# Patient Record
Sex: Female | Born: 1990
Health system: Southern US, Community
[De-identification: ages and names within clinical notes are randomized; demographics above are authoritative.]

## PROBLEM LIST (undated history)

## (undated) ENCOUNTER — Inpatient Hospital Stay (HOSPITAL_COMMUNITY): Payer: Self-pay

## (undated) DIAGNOSIS — F909 Attention-deficit hyperactivity disorder, unspecified type: Secondary | ICD-10-CM

## (undated) DIAGNOSIS — R112 Nausea with vomiting, unspecified: Secondary | ICD-10-CM

## (undated) DIAGNOSIS — R0600 Dyspnea, unspecified: Secondary | ICD-10-CM

## (undated) DIAGNOSIS — E119 Type 2 diabetes mellitus without complications: Secondary | ICD-10-CM

## (undated) DIAGNOSIS — Z9889 Other specified postprocedural states: Secondary | ICD-10-CM

## (undated) DIAGNOSIS — N390 Urinary tract infection, site not specified: Secondary | ICD-10-CM

## (undated) HISTORY — PX: APPENDECTOMY: SHX54

## (undated) HISTORY — PX: OTHER SURGICAL HISTORY: SHX169

## (undated) HISTORY — DX: Type 2 diabetes mellitus without complications: E11.9

---
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
## 2011-03-25 LAB — URINALYSIS, ROUTINE W REFLEX MICROSCOPIC
Bilirubin Urine: NEGATIVE
Ketones, ur: NEGATIVE
Nitrite: NEGATIVE
pH: 7.5

## 2011-03-25 LAB — COMPREHENSIVE METABOLIC PANEL
BUN: 6
Calcium: 9.3
Creatinine, Ser: 0.83
Glucose, Bld: 93
Sodium: 139
Total Protein: 6.4

## 2011-03-25 LAB — CBC
HCT: 35.2
Hemoglobin: 12.1
MCHC: 34.3 — ABNORMAL HIGH
MCV: 83.4
RDW: 13.9 — ABNORMAL HIGH

## 2011-03-25 LAB — DIFFERENTIAL
Lymphocytes Relative: 46
Lymphs Abs: 3.9
Monocytes Relative: 5
Neutro Abs: 4
Neutrophils Relative %: 47

## 2011-03-25 LAB — TSH: TSH: 2.1

## 2011-03-25 LAB — PREGNANCY, URINE: Preg Test, Ur: NEGATIVE

## 2011-10-10 ENCOUNTER — Emergency Department (HOSPITAL_COMMUNITY)
Admission: EM | Admit: 2011-10-10 | Discharge: 2011-10-11 | Disposition: A | Discharge status: Home / Self Care | Payer: 59 | Attending: Emergency Medicine | Admitting: Emergency Medicine

## 2011-10-10 ENCOUNTER — Emergency Department (HOSPITAL_COMMUNITY)
Admission: EM | Admit: 2011-10-10 | Discharge: 2011-10-10 | Disposition: A | Discharge status: Home / Self Care | Payer: 59 | Attending: Emergency Medicine | Admitting: Emergency Medicine

## 2011-10-10 ENCOUNTER — Encounter (HOSPITAL_COMMUNITY): Payer: Self-pay | Admitting: *Deleted

## 2011-10-10 ENCOUNTER — Emergency Department (HOSPITAL_COMMUNITY): Payer: 59

## 2011-10-10 DIAGNOSIS — R42 Dizziness and giddiness: Secondary | ICD-10-CM | POA: Insufficient documentation

## 2011-10-10 DIAGNOSIS — R197 Diarrhea, unspecified: Secondary | ICD-10-CM | POA: Insufficient documentation

## 2011-10-10 DIAGNOSIS — R110 Nausea: Secondary | ICD-10-CM | POA: Insufficient documentation

## 2011-10-10 DIAGNOSIS — R1030 Lower abdominal pain, unspecified: Secondary | ICD-10-CM

## 2011-10-10 DIAGNOSIS — R109 Unspecified abdominal pain: Secondary | ICD-10-CM | POA: Insufficient documentation

## 2011-10-10 DIAGNOSIS — N898 Other specified noninflammatory disorders of vagina: Secondary | ICD-10-CM | POA: Insufficient documentation

## 2011-10-10 DIAGNOSIS — N939 Abnormal uterine and vaginal bleeding, unspecified: Secondary | ICD-10-CM

## 2011-10-10 DIAGNOSIS — K529 Noninfective gastroenteritis and colitis, unspecified: Secondary | ICD-10-CM

## 2011-10-10 DIAGNOSIS — K5289 Other specified noninfective gastroenteritis and colitis: Secondary | ICD-10-CM | POA: Insufficient documentation

## 2011-10-10 LAB — POCT I-STAT, CHEM 8
HCT: 38 % (ref 36.0–46.0)
Hemoglobin: 12.9 g/dL (ref 12.0–15.0)
Potassium: 3.3 mEq/L — ABNORMAL LOW (ref 3.5–5.1)
Sodium: 140 mEq/L (ref 135–145)

## 2011-10-10 LAB — DIFFERENTIAL
Basophils Absolute: 0 10*3/uL (ref 0.0–0.1)
Basophils Relative: 0 % (ref 0–1)
Basophils Relative: 0 % (ref 0–1)
Lymphs Abs: 3.6 10*3/uL (ref 0.7–4.0)
Monocytes Relative: 6 % (ref 3–12)
Monocytes Relative: 4 % (ref 3–12)
Neutro Abs: 2.9 10*3/uL (ref 1.7–7.7)
Neutro Abs: 3.1 10*3/uL (ref 1.7–7.7)
Neutrophils Relative %: 41 % — ABNORMAL LOW (ref 43–77)
Neutrophils Relative %: 48 % (ref 43–77)

## 2011-10-10 LAB — COMPREHENSIVE METABOLIC PANEL
ALT: 12 U/L (ref 0–35)
Albumin: 3.9 g/dL (ref 3.5–5.2)
Alkaline Phosphatase: 46 U/L (ref 39–117)
BUN: 8 mg/dL (ref 6–23)
Chloride: 104 mEq/L (ref 96–112)
Glucose, Bld: 112 mg/dL — ABNORMAL HIGH (ref 70–99)
Potassium: 3.7 mEq/L (ref 3.5–5.1)
Sodium: 138 mEq/L (ref 135–145)
Total Bilirubin: 0.3 mg/dL (ref 0.3–1.2)

## 2011-10-10 LAB — URINALYSIS, ROUTINE W REFLEX MICROSCOPIC
Bilirubin Urine: NEGATIVE
Glucose, UA: NEGATIVE mg/dL
Hgb urine dipstick: NEGATIVE
Ketones, ur: NEGATIVE mg/dL
Leukocytes, UA: NEGATIVE
Nitrite: NEGATIVE
Nitrite: NEGATIVE
Specific Gravity, Urine: 1.025 (ref 1.005–1.030)
Specific Gravity, Urine: 1.022 (ref 1.005–1.030)
Urobilinogen, UA: 0.2 mg/dL (ref 0.0–1.0)
pH: 7 (ref 5.0–8.0)

## 2011-10-10 LAB — CBC
Hemoglobin: 12.6 g/dL (ref 12.0–15.0)
Hemoglobin: 12.8 g/dL (ref 12.0–15.0)
MCHC: 34.8 g/dL (ref 30.0–36.0)
Platelets: 244 10*3/uL (ref 150–400)
RBC: 4.33 MIL/uL (ref 3.87–5.11)

## 2011-10-10 LAB — URINE MICROSCOPIC-ADD ON

## 2011-10-10 LAB — LIPASE, BLOOD: Lipase: 38 U/L (ref 11–59)

## 2011-10-10 MED ORDER — SODIUM CHLORIDE 0.9 % IV SOLN
1000.0000 mL | INTRAVENOUS | Status: DC
  Administered 2011-10-10: 1000 mL via INTRAVENOUS

## 2011-10-10 MED ORDER — GI COCKTAIL ~~LOC~~
30.0000 mL | Freq: Once | ORAL | Status: AC
  Administered 2011-10-10: 30 mL via ORAL
  Filled 2011-10-10: qty 30

## 2011-10-10 MED ORDER — SODIUM CHLORIDE 0.9 % IV SOLN
1000.0000 mL | Freq: Once | INTRAVENOUS | Status: AC
  Administered 2011-10-10: 1000 mL via INTRAVENOUS

## 2011-10-10 MED ORDER — ONDANSETRON HCL 4 MG/2ML IJ SOLN
4.0000 mg | Freq: Once | INTRAMUSCULAR | Status: AC
  Administered 2011-10-10: 4 mg via INTRAVENOUS
  Filled 2011-10-10: qty 2

## 2011-10-10 MED ORDER — MORPHINE SULFATE 4 MG/ML IJ SOLN
4.0000 mg | Freq: Once | INTRAMUSCULAR | Status: AC
  Administered 2011-10-10: 4 mg via INTRAVENOUS
  Filled 2011-10-10: qty 1

## 2011-10-10 MED ORDER — OMEPRAZOLE 20 MG PO CPDR: 20.0000 mg | DELAYED_RELEASE_CAPSULE | Freq: Every day | ORAL | Status: DC

## 2011-10-10 MED ORDER — MORPHINE SULFATE 4 MG/ML IJ SOLN: 4.0000 mg | Freq: Once | INTRAMUSCULAR | Status: DC

## 2011-10-10 NOTE — Discharge Instructions (Signed)
B.R.A.T. Diet Your doctor has recommended the B.R.A.T. diet for you or your child until the condition improves. This is often used to help control diarrhea and vomiting symptoms. If you or your child can tolerate clear liquids, you may have:  Bananas.   Rice.   Applesauce.   Toast (and other simple starches such as crackers, potatoes, noodles).  Be sure to avoid dairy products, meats, and fatty foods until symptoms are better. Fruit juices such as apple, grape, and prune juice can make diarrhea worse. Avoid these. Continue this diet for 2 days or as instructed by your caregiver. Document Released: 06/01/2005 Document Revised: 05/21/2011 Document Reviewed: 11/18/2006 [employer] Patient Information 2012 [employer].Viral Gastroenteritis Viral gastroenteritis is also known as stomach flu. This condition affects the stomach and intestinal tract. It can cause sudden diarrhea and vomiting. The illness typically lasts 3 to 8 days. Most people develop an immune response that eventually gets rid of the virus. While this natural response develops, the virus can make you quite ill. CAUSES  Many different viruses can cause gastroenteritis, such as rotavirus or noroviruses. You can catch one of these viruses by consuming contaminated food or water. You may also catch a virus by sharing utensils or other personal items with an infected person or by touching a contaminated surface. SYMPTOMS  The most common symptoms are diarrhea and vomiting. These problems can cause a severe loss of body fluids (dehydration) and a body salt (electrolyte) imbalance. Other symptoms may include:  Fever.   Headache.   Fatigue.   Abdominal pain.  DIAGNOSIS  Your caregiver can usually diagnose viral gastroenteritis based on your symptoms and a physical exam. A stool sample may also be taken to test for the presence of viruses or other infections. TREATMENT  This illness typically goes away on its own. Treatments are aimed  at rehydration. The most serious cases of viral gastroenteritis involve vomiting so severely that you are not able to keep fluids down. In these cases, fluids must be given through an intravenous line (IV). HOME CARE INSTRUCTIONS   Drink enough fluids to keep your urine clear or pale yellow. Drink small amounts of fluids frequently and increase the amounts as tolerated.   Ask your caregiver for specific rehydration instructions.   Avoid:   Foods high in sugar.   Alcohol.   Carbonated drinks.   Tobacco.   Juice.   Caffeine drinks.   Extremely hot or cold fluids.   Fatty, greasy foods.   Too much intake of anything at one time.   Dairy products until 24 to 48 hours after diarrhea stops.   You may consume probiotics. Probiotics are active cultures of beneficial bacteria. They may lessen the amount and number of diarrheal stools in adults. Probiotics can be found in yogurt with active cultures and in supplements.   Wash your hands well to avoid spreading the virus.   Only take over-the-counter or prescription medicines for pain, discomfort, or fever as directed by your caregiver. Do not give aspirin to children. Antidiarrheal medicines are not recommended.   Ask your caregiver if you should continue to take your regular prescribed and over-the-counter medicines.   Keep all follow-up appointments as directed by your caregiver.  SEEK IMMEDIATE MEDICAL CARE IF:   You are unable to keep fluids down.   You do not urinate at least once every 6 to 8 hours.   You develop shortness of breath.   You notice blood in your stool or vomit. This may   look like coffee grounds.   You have abdominal pain that increases or is concentrated in one small area (localized).   You have persistent vomiting or diarrhea.   You have a fever.   The patient is a child younger than 3 months, and he or she has a fever.   The patient is a child older than 3 months, and he or she has a fever and  persistent symptoms.   The patient is a child older than 3 months, and he or she has a fever and symptoms suddenly get worse.   The patient is a baby, and he or she has no tears when crying.  MAKE SURE YOU:   Understand these instructions.   Will watch your condition.   Will get help right away if you are not doing well or get worse.  Document Released: 06/01/2005 Document Revised: 05/21/2011 Document Reviewed: 03/18/2011 [employer] Patient Information 2012 [employer]. 

## 2011-10-10 NOTE — ED Provider Notes (Signed)
Pt was transferred to CDU.  Her pain is improved after GI cocktail.  When she stood up the pain was worsened in CT scan.  Pain is located at left flank and mid abdomen.  No N/V.  I've given Morphine 4mg  IV if needed.  Awaiting results of CT scan.    Reviewed CT scan results with Dr. Nicanor Alcon and the radiologist.  Reviewed with pt that she has accumulation near her appendix that isn't showing appendicitis but does put pt at risk for appendicitis in the future.  I've discussed signs and sx that would prompt return.  Abd pain likely due to gastroenteritis.  Given omeprazole for sx relief.  Plan to f/u with gynecologist.  Pt and family voiced understanding.  Lindley Magnus [place], [place] 10/10/11 [phone]

## 2011-10-10 NOTE — ED Provider Notes (Signed)
History     CSN: [id number]  Arrival date & time 10/10/11  0865   First MD Initiated Contact with Patient 10/10/11 0423      Chief Complaint  Patient presents with  . Abdominal Pain    (Consider location/radiation/quality/duration/timing/severity/associated sxs/prior treatment) Patient is a 21 y.o. female presenting with abdominal pain. The history is provided by the patient. No language interpreter was used.  Abdominal Pain The primary symptoms of the illness include abdominal pain, nausea and diarrhea. The current episode started 3 to 5 hours ago. The onset of the illness was sudden. The problem has not changed since onset. The abdominal pain began 3 to 5 hours ago. The pain came on suddenly. The abdominal pain has been unchanged since its onset. The abdominal pain is located in the LLQ. The abdominal pain does not radiate. The severity of the abdominal pain is 10/10. The abdominal pain is relieved by nothing. Exacerbated by: nothing.  Associated with: nothing she has daily diarrhea. The patient states that she believes she is currently not pregnant. The patient has not had a change in bowel habit. Symptoms associated with the illness do not include chills, heartburn, constipation, urgency or frequency. Significant associated medical issues do not include PUD or inflammatory bowel disease.    History reviewed. No pertinent past medical history.  History reviewed. No pertinent past surgical history.  No family history on file.  History  Substance Use Topics  . Smoking status: Never Smoker   . Smokeless tobacco: Not on file  . Alcohol Use: No    OB History    Grav Para Term Preterm Abortions TAB SAB Ect Mult Living                  Review of Systems  Constitutional: Negative for chills.  Gastrointestinal: Positive for nausea, abdominal pain and diarrhea. Negative for heartburn and constipation.  Genitourinary: Negative for urgency and frequency.  All other systems  reviewed and are negative.    Allergies  Review of patient's allergies indicates no known allergies.  Home Medications   Current Outpatient Rx  Name Route Sig Dispense Refill  . LISDEXAMFETAMINE DIMESYLATE 50 MG PO CAPS Oral Take 50 mg by mouth every morning.      BP 122/77  Pulse 67  Temp(Src) 97.7 F (36.5 C) (Oral)  Resp 12  SpO2 99%  LMP 10/10/2011  Physical Exam  Constitutional: She is oriented to person, place, and time. She appears well-developed and well-nourished.  HENT:  Head: Normocephalic and atraumatic.  Mouth/Throat: Oropharynx is clear and moist.  Eyes: Conjunctivae are normal. Pupils are equal, round, and reactive to light.  Neck: Normal range of motion. Neck supple.  Cardiovascular: Normal rate and regular rhythm.   Pulmonary/Chest: Effort normal and breath sounds normal.  Abdominal: Soft. Bowel sounds are normal. There is no rebound and no guarding.    Genitourinary: No vaginal discharge found.       chaperone  Musculoskeletal: Normal range of motion.  Neurological: She is alert and oriented to person, place, and time.  Skin: Skin is warm and dry.  Psychiatric: She has a normal mood and affect.    ED Course  Procedures (including critical care time)  Labs Reviewed  DIFFERENTIAL - Abnormal; Notable for the following:    Neutrophils Relative 41 (*)    Lymphocytes Relative 51 (*)    All other components within normal limits  COMPREHENSIVE METABOLIC PANEL - Abnormal; Notable for the following:    Glucose, Bld  112 (*)    GFR calc non Af Amer 76 (*)    GFR calc Af Amer 89 (*)    All other components within normal limits  URINALYSIS, ROUTINE W REFLEX MICROSCOPIC - Abnormal; Notable for the following:    APPearance CLOUDY (*)    Hgb urine dipstick MODERATE (*)    All other components within normal limits  URINE MICROSCOPIC-ADD ON - Abnormal; Notable for the following:    Squamous Epithelial / LPF FEW (*)    All other components within normal  limits  WET PREP, GENITAL - Abnormal; Notable for the following:    Clue Cells Wet Prep HPF POC FEW (*)    All other components within normal limits  CBC  LIPASE, BLOOD  PREGNANCY, URINE  GC/CHLAMYDIA PROBE AMP, GENITAL   No results found.   No diagnosis found.    MDM  Follow up with GYN, fifteen days of menses.  Must have pap smear and ongoing care for menorrhagia.         Jasmine Awe, MD 10/10/11 [phone]

## 2011-10-10 NOTE — ED Notes (Signed)
Pt states that she is having abdominal pain that starts in her belly button and radiates out. Pt states that pressure is better when she lays on her stomach she feel better. Pt states that she has had her period for the past 15 days and is recently on [facility] pills started Monday. Pt denies problems with bowel movements or urination.

## 2011-10-10 NOTE — ED Notes (Signed)
Patient currently resting quietly in bed; no respiratory or acute distress noted.  PA currently at bedside; will continue to monitor.

## 2011-10-10 NOTE — ED Provider Notes (Signed)
History     CSN: [id number]  Arrival date & time 10/10/11  2007   First MD Initiated Contact with Patient 10/10/11 2105      Chief Complaint  Patient presents with  . Nausea    (Consider location/radiation/quality/duration/timing/severity/associated sxs/prior treatment) HPI Comments: Patient was seen in the Emergency Room this morning for abdominal pain, vaginal bleeding, daily diarrhea, d/c home with suspected gastroenteritis and abnormal vaginal bleeding.  States that the pain has continued, that it is now "lower down" in her abdomen than before, she has developed nausea, vomiting ("only in my mouth"), lightheadedness, is unable to eat. States initially the pain was epigastric(2 days ago), this morning was periumbilical (1am, sudden onset, sharp in nature), and now pt points to suprapubic region to indicate worst pain.  States her vaginal bleeding has been lighter today and she has not had any diarrhea, no bowel movement since yesterday.  Mother notes patient has had intermittent diarrhea and hard stools for years, a few days ago had large amount of blood in her bowel movement.  Notes patient's grandmother has Crohn's disease. Denies fevers, urinary symptoms.    The history is provided by the patient.    History reviewed. No pertinent past medical history.  History reviewed. No pertinent past surgical history.  No family history on file.  History  Substance Use Topics  . Smoking status: Never Smoker   . Smokeless tobacco: Not on file  . Alcohol Use: No    OB History    Grav Para Term Preterm Abortions TAB SAB Ect Mult Living                  Review of Systems  Constitutional: Positive for appetite change. Negative for fever.  Gastrointestinal: Positive for nausea and abdominal pain. Negative for diarrhea and blood in stool.  Genitourinary: Positive for vaginal bleeding. Negative for dysuria, urgency and frequency.  Neurological: Positive for light-headedness.     Allergies  Review of patient's allergies indicates no known allergies.  Home Medications   Current Outpatient Rx  Name Route Sig Dispense Refill  . LISDEXAMFETAMINE DIMESYLATE 50 MG PO CAPS Oral Take 50 mg by mouth every morning.    Azzie Roup ACE-ETH ESTRAD-FE 1-20 MG-MCG PO TABS Oral Take 1 tablet by mouth daily.    Marland Kitchen OMEPRAZOLE 20 MG PO CPDR Oral Take 20 mg by mouth daily.      BP 117/61  Pulse 67  Temp(Src) 97.5 F (36.4 C) (Oral)  Resp 18  SpO2 100%  LMP 10/10/2011  Physical Exam  Nursing note and vitals reviewed. Constitutional: She is oriented to person, place, and time. She appears well-developed and well-nourished.  HENT:  Head: Normocephalic and atraumatic.  Neck: Neck supple.  Cardiovascular: Normal rate, regular rhythm and normal heart sounds.   Pulmonary/Chest: Breath sounds normal. No respiratory distress. She has no wheezes. She has no rales. She exhibits no tenderness.  Abdominal: Soft. Bowel sounds are normal. She exhibits no distension and no mass. There is tenderness in the right lower quadrant, suprapubic area and left lower quadrant. There is no rebound and no guarding.  Neurological: She is alert and oriented to person, place, and time.  Psychiatric: She has a normal mood and affect. Her behavior is normal. Judgment and thought content normal.    ED Course  Procedures (including critical care time)  Labs Reviewed  URINALYSIS, ROUTINE W REFLEX MICROSCOPIC - Abnormal; Notable for the following:    APPearance CLOUDY (*)    Bilirubin  Urine SMALL (*)    All other components within normal limits  POCT I-STAT, CHEM 8 - Abnormal; Notable for the following:    Potassium 3.3 (*)    All other components within normal limits  CBC  DIFFERENTIAL  URINE CULTURE   Ct Abdomen Pelvis Wo Contrast  10/10/2011  *RADIOLOGY REPORT*  Clinical Data: Left flank pain  CT ABDOMEN AND PELVIS WITHOUT CONTRAST  Technique:  Multidetector CT imaging of the abdomen and  pelvis was performed following the standard protocol without intravenous contrast.  Comparison: None.  Findings: Lung bases are clear.  No pericardial fluid.  No focal hepatic lesion on this noncontrast exam.  Gallbladder, pancreas, spleen, adrenal glands are normal.  There is no nephrolithiasis or ureterolithiasis.  Kidneys appear normal.  The stomach, small bowel, and cecum are normal.  The proximal appendix is normal caliber without inflammation.  The tip of the appendix is expanded by a high density material over a 2 cm segment and mildly dilated to 8 mm (image 60).  There is no evidence of inflammation at the tip of the appendix to suggest acute appendicitis.  The colon and rectosigmoid colon are normal.  Abdominal aorta normal caliber.  No retroperitoneal lymphadenopathy.  No free fluid the pelvis.  There is a low density cyst in the right ovary measuring 19 mm most consistent with dominant follicle.  The uterus appears normal.  The bladder appears normal.  Left ovary is normal.  No distal ureteral stones or bladder stones. Review of bone windows demonstrates no aggressive osseous lesions.  IMPRESSION:  1.  No acute abdominal or pelvic process. 2.  No nephrolithiasis or ureterolithiasis. 3.  Probable dominant follicle right ovary. 4.  High-density material expands the tip of the appendix without evidence of acute appendicitis.  Original Report Authenticated By: Genevive Bi, M.D.   9:21 PM I have seen and examined patient, reviewed the notes, labs, and CT from visit earlier today.  IVF, pain and nausea medication, labs ordered.    10:24 PM Discussed results with patient and parents.  Patient reports pain is now a 3/10.  On reexamination of abdomen, NABS, nondistended, soft, generalized tenderness, worst in suprapubic, LLQ, no guarding, no rebound.  UA pending.  Patient has failed oral trial, vomiting PO challenge.    10:46 PM Reexamination of abdomen: soft, nondistended, TTP RLQ, suprapubic area, no  guarding, no rebound.    10:58 PM Discussed patient with Dr Lynelle Doctor who suggested pelvic US and [place] to examine appendix.    11:52 PM Patient reports pain is well controlled.  Exam is unchanged.  Abdomen is soft, nondistended, TTP RLQ, suprapubic, LLQ, no guarding ,no rebound.  Declines any medication at this time.  I have explained to patient and family that [place] may take awhile and they are prepared for this.  If [place] is negative, patient to be d/c home with pain and nausea medication, GI follow up (mother prefers Dr Regino Schultze office).    1. Lower abdominal pain   2. Abnormal vaginal bleeding       MDM  Patient with abdominal pain progressively migrating towards her lower abdomen, seen this morning in the ED for same - diagnosed with suspected gastroenteritis and abnormal vaginal bleeding, sent home with gyn follow up.  Pt returns for worsening pain, not tolerating PO.  Pt is afebrile, WBC normal.  Pt have [place] to examine appendix and pelvic US.  If normal, pt to be d/c home with pain and nausea medication, GI follow  up.  Patient signed out to Dr Norlene Campbell who assumes care of patient at change of shift.         Dillard Cannon [place], [place] 10/11/11 0002

## 2011-10-10 NOTE — ED Provider Notes (Signed)
Medical screening examination/treatment/procedure(s) were performed by non-physician practitioner and as supervising physician I was immediately available for consultation/collaboration.  Jasmine Awe, MD 10/10/11 [phone]

## 2011-10-10 NOTE — ED Notes (Signed)
Mid abd pain for 2-3 hours.  No nv or diarrhea.  lmp  Now for 15 days

## 2011-10-10 NOTE — ED Notes (Signed)
The pt was just seen here this am for abd pain.  Now she has dizziness and nausea

## 2011-10-10 NOTE — ED Notes (Signed)
Patient complaining of nausea, abdominal pain, and dizziness.  Patient states that her symptoms started on Wednesday (reported nausea, vomiting, blood in stool, and abdominal pain).  Patient states that she was seen here this morning; given meds, IV fluids, had blood work done, urine test done, and CT done.  Patient states that there prescription medications are not helping with her symptoms.  Reports nausea and abdominal pain at this time; states that she has not vomited within the last 24 hours.  Last bowel movement was this morning; denies blood in stool.  Denies any urinary or vaginal changes.  Upon arrival to room, patient changed into gown.  Will continue to monitor.

## 2011-10-10 NOTE — ED Notes (Signed)
Patient currently resting quietly in bed; no respiratory or acute distress noted.  Patient updated on plan of care; informed patient that we are currently waiting on further instructions from PA.  Will continue to monitor.

## 2011-10-11 ENCOUNTER — Emergency Department (HOSPITAL_COMMUNITY): Payer: 59

## 2011-10-11 MED ORDER — ONDANSETRON HCL 4 MG PO TABS: 4.0000 mg | ORAL_TABLET | Freq: Four times a day (QID) | ORAL | Status: DC

## 2011-10-11 MED ORDER — OXYCODONE-ACETAMINOPHEN 5-325 MG PO TABS: 2.0000 | ORAL_TABLET | ORAL | Status: AC | PRN

## 2011-10-11 MED ORDER — DICYCLOMINE HCL 20 MG PO TABS
20.0000 mg | ORAL_TABLET | Freq: Once | ORAL | Status: AC
  Administered 2011-10-11: 20 mg via ORAL
  Filled 2011-10-11: qty 1

## 2011-10-11 MED ORDER — MORPHINE SULFATE 2 MG/ML IJ SOLN
2.0000 mg | Freq: Once | INTRAMUSCULAR | Status: AC
  Administered 2011-10-11: 2 mg via INTRAVENOUS
  Filled 2011-10-11: qty 1

## 2011-10-11 MED ORDER — DICYCLOMINE HCL 20 MG PO TABS: 20.0000 mg | ORAL_TABLET | Freq: Four times a day (QID) | ORAL | Status: DC

## 2011-10-11 NOTE — ED Notes (Signed)
Advised MD of pt c/o pain. Received verbal order.

## 2011-10-11 NOTE — ED Provider Notes (Signed)
Medical screening examination/treatment/procedure(s) were performed by non-physician practitioner and as supervising physician I was immediately available for consultation/collaboration. Devoria Albe, MD, Armando Gang   Ward Givens, MD 10/11/11 [phone]

## 2011-10-11 NOTE — ED Notes (Signed)
Patient transported to Ultrasound by EMT.

## 2011-10-11 NOTE — Discharge Instructions (Signed)
Please take medications as prescribed.  Your work up today has not shown a specific cause for your symptoms.  Please follow up with the gastroenterologist as directed as well as your gynecologist for further workup and evaluation.  Return to the ER for fevers, worsening pain, vomiting despite medications, fever, or other concerning symptoms.  Abdominal Pain Abdominal pain can be caused by many things. Your caregiver decides the seriousness of your pain by an examination and possibly blood tests and X-rays. Many cases can be observed and treated at home. Most abdominal pain is not caused by a disease and will probably improve without treatment. However, in many cases, more time must pass before a clear cause of the pain can be found. Before that point, it may not be known if you need more testing, or if hospitalization or surgery is needed. HOME CARE INSTRUCTIONS   Do not take laxatives unless directed by your caregiver.   Take pain medicine only as directed by your caregiver.   Only take over-the-counter or prescription medicines for pain, discomfort, or fever as directed by your caregiver.   Try a clear liquid diet (broth, tea, or water) for as long as directed by your caregiver. Slowly move to a bland diet as tolerated.  SEEK IMMEDIATE MEDICAL CARE IF:   The pain does not go away.   You have a fever.   You keep throwing up (vomiting).   The pain is felt only in portions of the abdomen. Pain in the right side could possibly be appendicitis. In an adult, pain in the left lower portion of the abdomen could be colitis or diverticulitis.   You pass bloody or black tarry stools.  MAKE SURE YOU:   Understand these instructions.   Will watch your condition.   Will get help right away if you are not doing well or get worse.  Document Released: 03/11/2005 Document Revised: 05/21/2011 Document Reviewed: 01/18/2008 [facility] Patient Information 2012 [place], [place].

## 2011-10-12 ENCOUNTER — Telehealth: Payer: Self-pay | Admitting: *Deleted

## 2011-10-12 ENCOUNTER — Encounter: Payer: Self-pay | Admitting: *Deleted

## 2011-10-12 ENCOUNTER — Observation Stay (HOSPITAL_COMMUNITY)
Admission: EM | Admit: 2011-10-12 | Discharge: 2011-10-14 | Disposition: A | Discharge status: Home / Self Care | Payer: 59 | Attending: General Surgery | Admitting: General Surgery

## 2011-10-12 DIAGNOSIS — Z01812 Encounter for preprocedural laboratory examination: Secondary | ICD-10-CM | POA: Insufficient documentation

## 2011-10-12 DIAGNOSIS — K3580 Unspecified acute appendicitis: Secondary | ICD-10-CM

## 2011-10-12 HISTORY — DX: Nausea with vomiting, unspecified: R11.2

## 2011-10-12 HISTORY — DX: Nausea with vomiting, unspecified: Z98.890

## 2011-10-12 HISTORY — DX: Attention-deficit hyperactivity disorder, unspecified type: F90.9

## 2011-10-12 LAB — GC/CHLAMYDIA PROBE AMP, GENITAL
Chlamydia, DNA Probe: NEGATIVE
GC Probe Amp, Genital: NEGATIVE

## 2011-10-12 LAB — DIFFERENTIAL
Basophils Absolute: 0 10*3/uL (ref 0.0–0.1)
Eosinophils Absolute: 0.1 10*3/uL (ref 0.0–0.7)
Eosinophils Relative: 1 % (ref 0–5)
Lymphocytes Relative: 38 % (ref 12–46)

## 2011-10-12 LAB — CBC
MCV: 85.4 fL (ref 78.0–100.0)
Platelets: ADEQUATE 10*3/uL (ref 150–400)
RDW: 12.1 % (ref 11.5–15.5)
WBC: 6.6 10*3/uL (ref 4.0–10.5)

## 2011-10-12 LAB — URINE CULTURE

## 2011-10-12 LAB — COMPREHENSIVE METABOLIC PANEL
Albumin: 3.4 g/dL — ABNORMAL LOW (ref 3.5–5.2)
Alkaline Phosphatase: 34 U/L — ABNORMAL LOW (ref 39–117)
BUN: 7 mg/dL (ref 6–23)
CO2: 20 mEq/L (ref 19–32)
Chloride: 109 mEq/L (ref 96–112)
Glucose, Bld: 61 mg/dL — ABNORMAL LOW (ref 70–99)
Potassium: 3.9 mEq/L (ref 3.5–5.1)
Total Bilirubin: 0.3 mg/dL (ref 0.3–1.2)

## 2011-10-12 MED ORDER — SODIUM CHLORIDE 0.9 % IV BOLUS (SEPSIS)
1000.0000 mL | Freq: Once | INTRAVENOUS | Status: AC
  Administered 2011-10-12: 1000 mL via INTRAVENOUS

## 2011-10-12 MED ORDER — ONDANSETRON HCL 4 MG/2ML IJ SOLN
4.0000 mg | Freq: Four times a day (QID) | INTRAMUSCULAR | Status: DC | PRN
  Administered 2011-10-12: 4 mg via INTRAVENOUS
  Filled 2011-10-12: qty 2

## 2011-10-12 MED ORDER — SODIUM CHLORIDE 0.9 % IV SOLN
INTRAVENOUS | Status: DC
  Administered 2011-10-12: 21:00:00 via INTRAVENOUS

## 2011-10-12 MED ORDER — ACETAMINOPHEN 650 MG RE SUPP: 650.0000 mg | Freq: Four times a day (QID) | RECTAL | Status: DC | PRN

## 2011-10-12 MED ORDER — ACETAMINOPHEN 325 MG PO TABS: 650.0000 mg | ORAL_TABLET | Freq: Four times a day (QID) | ORAL | Status: DC | PRN

## 2011-10-12 MED ORDER — PANTOPRAZOLE SODIUM 40 MG IV SOLR
40.0000 mg | Freq: Every day | INTRAVENOUS | Status: DC
  Administered 2011-10-12: 40 mg via INTRAVENOUS
  Filled 2011-10-12 (×2): qty 40

## 2011-10-12 MED ORDER — MORPHINE SULFATE 2 MG/ML IJ SOLN
2.0000 mg | INTRAMUSCULAR | Status: DC | PRN
  Administered 2011-10-12 – 2011-10-13 (×6): 2 mg via INTRAVENOUS
  Filled 2011-10-12 (×5): qty 1

## 2011-10-12 NOTE — H&P (Signed)
Ashley Hester is an 21 y.o. female.   Chief Complaint: ab pain HPI:  This is a 21 year old female who has had abdominal pain since last Thursday. Last Thursday night she began to have epigastric pain that radiated to her back. This worsened and on Friday appeared at her umbilicus. Since then it has progressed to being both lower quadrants. Initially was primarily her left but now this is primarily on her right side. She states when he pressed her left side it now hurts on her right side also. She was given some Prilosec in the emergency room the first night without any real relief. She returned on Saturday with nausea and underwent a CT scan of which the results are below and I've reviewed. She has she underwent ultrasound as well to follow up the CT findings later. She does complain that she was having some questionable hematuria she was  seen by urology today for evaluation of that. Dr. Annabell Howells was concerned with ct scan and possible appendicitis and he contacted me. She's recently started on oral contraceptive due to the fact that she has had some abnormal periods. She did have a bowel movement on Friday and is passing flatus now. She denies any fevers. She denies any travel history. She has for several weeks has had some alternating constipation and diarrhea. Past Medical History  Diagnosis Date  . ADHD (attention deficit hyperactivity disorder)   . Asthma     sport induced  . PONV (postoperative nausea and vomiting)     Hard to wake up    Past Surgical History  Procedure Date  . Wisdon teeth extraction     Family History  Problem Relation Age of Onset  . Crohn's disease Maternal Grandmother    Social History:  reports that she has never smoked. She has never used smokeless tobacco. She reports that she does not drink alcohol or use illicit drugs.  Allergies: No Known Allergies OCPs for meds  (Not in a hospital admission)  Results for orders placed during the hospital encounter of  10/10/11 (from the past 48 hour(s))  CBC     Status: Normal   Collection Time   10/10/11  9:22 PM      Component Value Range Comment   WBC 6.4  4.0 - 10.5 (K/uL)    RBC 4.31  3.87 - 5.11 (MIL/uL)    Hemoglobin 12.8  12.0 - 15.0 (g/dL)    HCT 09.8  11.9 - 14.7 (%)    MCV 85.4  78.0 - 100.0 (fL)    MCH 29.7  26.0 - 34.0 (pg)    MCHC 34.8  30.0 - 36.0 (g/dL)    RDW 82.9  56.2 - 13.0 (%)    Platelets 254  150 - 400 (K/uL)   DIFFERENTIAL     Status: Normal   Collection Time   10/10/11  9:22 PM      Component Value Range Comment   Neutrophils Relative 48  43 - 77 (%)    Neutro Abs 3.1  1.7 - 7.7 (K/uL)    Lymphocytes Relative 46  12 - 46 (%)    Lymphs Abs 3.0  0.7 - 4.0 (K/uL)    Monocytes Relative 4  3 - 12 (%)    Monocytes Absolute 0.3  0.1 - 1.0 (K/uL)    Eosinophils Relative 1  0 - 5 (%)    Eosinophils Absolute 0.1  0.0 - 0.7 (K/uL)    Basophils Relative 0  0 - 1 (%)  Basophils Absolute 0.0  0.0 - 0.1 (K/uL)   POCT I-STAT, CHEM 8     Status: Abnormal   Collection Time   10/10/11  9:37 PM      Component Value Range Comment   Sodium 140  135 - 145 (mEq/L)    Potassium 3.3 (*) 3.5 - 5.1 (mEq/L)    Chloride 106  96 - 112 (mEq/L)    BUN 7  6 - 23 (mg/dL)    Creatinine, Ser 4.54  0.50 - 1.10 (mg/dL)    Glucose, Bld 90  70 - 99 (mg/dL)    Calcium, Ion 0.98  1.12 - 1.32 (mmol/L)    TCO2 23  0 - 100 (mmol/L)    Hemoglobin 12.9  12.0 - 15.0 (g/dL)    HCT 11.9  14.7 - 82.9 (%)   URINALYSIS, ROUTINE W REFLEX MICROSCOPIC     Status: Abnormal   Collection Time   10/10/11 10:21 PM      Component Value Range Comment   Color, Urine YELLOW  YELLOW     APPearance CLOUDY (*) CLEAR     Specific Gravity, Urine 1.022  1.005 - 1.030     pH 6.5  5.0 - 8.0     Glucose, UA NEGATIVE  NEGATIVE (mg/dL)    Hgb urine dipstick NEGATIVE  NEGATIVE     Bilirubin Urine SMALL (*) NEGATIVE     Ketones, ur NEGATIVE  NEGATIVE (mg/dL)    Protein, ur NEGATIVE  NEGATIVE (mg/dL)    Urobilinogen, UA 0.2  0.0 -  1.0 (mg/dL)    Nitrite NEGATIVE  NEGATIVE     Leukocytes, UA NEGATIVE  NEGATIVE  MICROSCOPIC NOT DONE ON URINES WITH NEGATIVE PROTEIN, BLOOD, LEUKOCYTES, NITRITE, OR GLUCOSE <1000 mg/dL.  URINE CULTURE     Status: Normal   Collection Time   10/10/11 10:21 PM      Component Value Range Comment   Specimen Description URINE, CLEAN CATCH      Special Requests ADDED ON [id number] @2249       Culture  Setup Time [id number]      Colony Count 45,000 COLONIES/ML      Culture        Value: GROUP B STREP(S.AGALACTIAE)ISOLATED     Note: TESTING AGAINST S. AGALACTIAE NOT ROUTINELY PERFORMED DUE TO PREDICTABILITY OF AMP/PEN/VAN SUSCEPTIBILITY.   Report Status 10/12/2011 FINAL      US Transvaginal Non-ob  10/11/2011  *RADIOLOGY REPORT*  Clinical Data: Pelvic pain, bleeding.  TRANSABDOMINAL AND TRANSVAGINAL ULTRASOUND OF PELVIS Technique:  Both transabdominal and transvaginal ultrasound examinations of the pelvis were performed. Transabdominal technique was performed for global imaging of the pelvis including uterus, ovaries, adnexal regions, and pelvic cul-de-sac.  Comparison: 10/10/2011 CT   It was necessary to proceed with endovaginal exam following the transabdominal exam to visualize the endometrium and adnexa.  Findings:  Uterus: Retroverted, normal appearance.  Measures 8.0 x 4.5 x 4.1 cm.  Endometrium:  Normal appearance and thickness, measuring 8 mm.  Right ovary:  Measures 4.0 x 2.1 x 2.6 cm.  Physiologic follicles, measuring up to 1.9 cm.  Left ovary: Measures 3.0 x 1.6 x 3.3 cm.  Physiologic follicles.  Other findings: There is a small amount offree fluid.  Arterial and venous wave forms and color Doppler flow documented to both ovaries.  IMPRESSION: Small amount of free fluid, often physiologic.  Color Doppler flow with arterial and venous wave forms documented to bilateral normal sized ovaries.  Original Report Authenticated By: Waneta Martins,  M.D.   US Pelvis Complete  10/11/2011  *RADIOLOGY  REPORT*  Clinical Data: Pelvic pain, bleeding.  TRANSABDOMINAL AND TRANSVAGINAL ULTRASOUND OF PELVIS Technique:  Both transabdominal and transvaginal ultrasound examinations of the pelvis were performed. Transabdominal technique was performed for global imaging of the pelvis including uterus, ovaries, adnexal regions, and pelvic cul-de-sac.  Comparison: 10/10/2011 CT   It was necessary to proceed with endovaginal exam following the transabdominal exam to visualize the endometrium and adnexa.  Findings:  Uterus: Retroverted, normal appearance.  Measures 8.0 x 4.5 x 4.1 cm.  Endometrium:  Normal appearance and thickness, measuring 8 mm.  Right ovary:  Measures 4.0 x 2.1 x 2.6 cm.  Physiologic follicles, measuring up to 1.9 cm.  Left ovary: Measures 3.0 x 1.6 x 3.3 cm.  Physiologic follicles.  Other findings: There is a small amount offree fluid.  Arterial and venous wave forms and color Doppler flow documented to both ovaries.  IMPRESSION: Small amount of free fluid, often physiologic.  Color Doppler flow with arterial and venous wave forms documented to bilateral normal sized ovaries.  Original Report Authenticated By: Waneta Martins, M.D.   US Abdomen Limited  10/11/2011  *RADIOLOGY REPORT*  Clinical Data: Vaginal bleeding, pelvic pain.  LIMITED ABDOMINAL ULTRASOUND  Comparison:  10/10/2011 CT  Findings: The appendix is not visualized by sonographic technique.  IMPRESSION: Appendix not visualized.  Original Report Authenticated By: Waneta Martins, M.D.   [place] Art/ven Flow Abd Pelv Doppler  10/11/2011  *RADIOLOGY REPORT*  Clinical Data: Pelvic pain, bleeding.  TRANSABDOMINAL AND TRANSVAGINAL ULTRASOUND OF PELVIS Technique:  Both transabdominal and transvaginal ultrasound examinations of the pelvis were performed. Transabdominal technique was performed for global imaging of the pelvis including uterus, ovaries, adnexal regions, and pelvic cul-de-sac.  Comparison: 10/10/2011 CT   It was necessary to proceed  with endovaginal exam following the transabdominal exam to visualize the endometrium and adnexa.  Findings:  Uterus: Retroverted, normal appearance.  Measures 8.0 x 4.5 x 4.1 cm.  Endometrium:  Normal appearance and thickness, measuring 8 mm.  Right ovary:  Measures 4.0 x 2.1 x 2.6 cm.  Physiologic follicles, measuring up to 1.9 cm.  Left ovary: Measures 3.0 x 1.6 x 3.3 cm.  Physiologic follicles.  Other findings: There is a small amount offree fluid.  Arterial and venous wave forms and color Doppler flow documented to both ovaries.  IMPRESSION: Small amount of free fluid, often physiologic.  Color Doppler flow with arterial and venous wave forms documented to bilateral normal sized ovaries.  Original Report Authenticated By: Waneta Martins, M.D.    Review of Systems  Constitutional: Negative for fever and chills.  Gastrointestinal: Positive for nausea and abdominal pain. Negative for vomiting, diarrhea and constipation.    Blood pressure 108/54, pulse 66, temperature 98.1 F (36.7 C), temperature source Oral, resp. rate 18, weight 115 lb (52.164 kg), last menstrual period 09/25/2011, SpO2 100.00%. Physical Exam  Vitals reviewed. Constitutional: She is oriented to person, place, and time. She appears well-developed and well-nourished.  Neck: Neck supple.  Cardiovascular: Normal rate, regular rhythm and normal heart sounds.   Respiratory: Effort normal and breath sounds normal. She has no wheezes. She has no rales.  GI: Soft. She exhibits no distension. There is tenderness (rlq tender, llq mildly tender with ? rovsings sign). There is no rebound and no guarding.  Lymphadenopathy:    She has no cervical adenopathy.  Neurological: She is alert and oriented to person, place, and time.  Assessment/Plan Abdominal pain  I'm not entirely sure where her abdominal pain is coming from right now. Her appendix is not normal on her CT scan but it does not look like she has appendicitis. I reviewed  this in depth with the radiologist who read it initially. She has a normal white blood cell count and has no fever. However she does have pain in her right lower quadrant and an exam that certainly could be consistent with appendicitis. I would've expected her to begin getting worse or develop some other symptoms though. We discussed multiple options and I think we may very well end up doing a diagnostic laparoscopy for this. I would like to however admit her and do some serial exams. I would like to recheck her white blood cell count tonight as well as a lipase to ensure that there is not another source of her pain as I do not see any liver function tests and lipase drawn. This is especially true given the fact she had epigastric pain radiated to her back is the start of all of this. There are understanding of this and I will plan on admitting her. If she still has pain without another source and we discussed the diagnostic laparoscopy tomorrow. I don't think this is coming from her ovarian cyst that is present that although that is a possibility.  Rima Blizzard 10/12/2011, 5:08 PM

## 2011-10-12 NOTE — ED Notes (Signed)
Per general surgery pt is to be admitted.

## 2011-10-12 NOTE — ED Notes (Signed)
Pt states "have been to the urologist, has had no elevated WBC or fever, have been to [facility] x 2, c/o lower abd pain"

## 2011-10-12 NOTE — ED Provider Notes (Signed)
History     CSN: [id number]  Arrival date & time 10/12/11  1345   First MD Initiated Contact with Patient 10/12/11 1516      Chief Complaint  Patient presents with  . Abdominal Pain    (Consider location/radiation/quality/duration/timing/severity/associated sxs/prior treatment) HPI Comments: Pt presents with 4 days hx of worsening pain to lower abdomen.  Started in peri-umbilical area, now in lower abdomen.  Had CT abd/pelvis 2 days ago showing material in appendix, but no inflammatory changes.  Pelvic exam unremarkable.  Was seen back in ED later that day and had pelvic and abd u/s.  Nothing unremarkable on pelvic u/s.  Appendix not viusualized.  +nausea, no vomiting.  No fevers.  Was seen by Dr. Annabell Howells today with urology due to hematuria on one of the visits.  He discussed findings with Dr. Dwain Sarna and they were told to come to ED to be seen by Dr. Dwain Sarna  The history is provided by the patient.    Past Medical History  Diagnosis Date  . ADHD (attention deficit hyperactivity disorder)   . Asthma     sport induced  . PONV (postoperative nausea and vomiting)     Hard to wake up    Past Surgical History  Procedure Date  . Wisdon teeth extraction     Family History  Problem Relation Age of Onset  . Crohn's disease Maternal Grandmother     History  Substance Use Topics  . Smoking status: Never Smoker   . Smokeless tobacco: Never Used  . Alcohol Use: No    OB History    Grav Para Term Preterm Abortions TAB SAB Ect Mult Living                  Review of Systems  Constitutional: Negative for fever, chills, diaphoresis and fatigue.  HENT: Negative for congestion, rhinorrhea and sneezing.   Eyes: Negative.   Respiratory: Negative for cough, chest tightness and shortness of breath.   Cardiovascular: Negative for chest pain and leg swelling.  Gastrointestinal: Positive for nausea, vomiting and abdominal pain. Negative for diarrhea and blood in stool.    Genitourinary: Negative for frequency, hematuria, flank pain and difficulty urinating.  Musculoskeletal: Negative for back pain and arthralgias.  Skin: Negative for rash.  Neurological: Negative for dizziness, speech difficulty, weakness, numbness and headaches.    Allergies  Review of patient's allergies indicates no known allergies.  Home Medications   No current outpatient prescriptions on file.  BP 108/72  Pulse 71  Temp(Src) 98 F (36.7 C) (Oral)  Resp 18  Ht 5\' 4"  (1.626 m)  Wt 115 lb (52.164 kg)  BMI 19.74 kg/m2  SpO2 99%  LMP 09/25/2011  Physical Exam  Constitutional: She is oriented to person, place, and time. She appears well-developed and well-nourished.  HENT:  Head: Normocephalic and atraumatic.  Eyes: Pupils are equal, round, and reactive to light.  Neck: Normal range of motion. Neck supple.  Cardiovascular: Normal rate, regular rhythm and normal heart sounds.   Pulmonary/Chest: Effort normal and breath sounds normal. No respiratory distress. She has no wheezes. She has no rales. She exhibits no tenderness.  Abdominal: Soft. Bowel sounds are normal. There is tenderness. There is guarding. There is no rebound.       Moderate TTP RLQ/suprapubic area  Musculoskeletal: Normal range of motion. She exhibits no edema.  Lymphadenopathy:    She has no cervical adenopathy.  Neurological: She is alert and oriented to person, place, and time.  Skin: Skin is warm and dry. No rash noted.  Psychiatric: She has a normal mood and affect.    ED Course  Procedures (including critical care time)  Labs Reviewed  COMPREHENSIVE METABOLIC PANEL - Abnormal; Notable for the following:    Glucose, Bld 61 (*)    Calcium 8.2 (*)    Albumin 3.4 (*)    Alkaline Phosphatase 34 (*)    GFR calc non Af Amer 73 (*)    GFR calc Af Amer 85 (*)    All other components within normal limits  CBC - Abnormal; Notable for the following:    RBC 3.64 (*)    Hemoglobin 10.6 (*)    HCT 31.1  (*)    All other components within normal limits  DIFFERENTIAL  LIPASE, BLOOD  COMPREHENSIVE METABOLIC PANEL  CBC   US Transvaginal Non-ob  10/11/2011  *RADIOLOGY REPORT*  Clinical Data: Pelvic pain, bleeding.  TRANSABDOMINAL AND TRANSVAGINAL ULTRASOUND OF PELVIS Technique:  Both transabdominal and transvaginal ultrasound examinations of the pelvis were performed. Transabdominal technique was performed for global imaging of the pelvis including uterus, ovaries, adnexal regions, and pelvic cul-de-sac.  Comparison: 10/10/2011 CT   It was necessary to proceed with endovaginal exam following the transabdominal exam to visualize the endometrium and adnexa.  Findings:  Uterus: Retroverted, normal appearance.  Measures 8.0 x 4.5 x 4.1 cm.  Endometrium:  Normal appearance and thickness, measuring 8 mm.  Right ovary:  Measures 4.0 x 2.1 x 2.6 cm.  Physiologic follicles, measuring up to 1.9 cm.  Left ovary: Measures 3.0 x 1.6 x 3.3 cm.  Physiologic follicles.  Other findings: There is a small amount offree fluid.  Arterial and venous wave forms and color Doppler flow documented to both ovaries.  IMPRESSION: Small amount of free fluid, often physiologic.  Color Doppler flow with arterial and venous wave forms documented to bilateral normal sized ovaries.  Original Report Authenticated By: Waneta Martins, M.D.   US Pelvis Complete  10/11/2011  *RADIOLOGY REPORT*  Clinical Data: Pelvic pain, bleeding.  TRANSABDOMINAL AND TRANSVAGINAL ULTRASOUND OF PELVIS Technique:  Both transabdominal and transvaginal ultrasound examinations of the pelvis were performed. Transabdominal technique was performed for global imaging of the pelvis including uterus, ovaries, adnexal regions, and pelvic cul-de-sac.  Comparison: 10/10/2011 CT   It was necessary to proceed with endovaginal exam following the transabdominal exam to visualize the endometrium and adnexa.  Findings:  Uterus: Retroverted, normal appearance.  Measures 8.0 x 4.5  x 4.1 cm.  Endometrium:  Normal appearance and thickness, measuring 8 mm.  Right ovary:  Measures 4.0 x 2.1 x 2.6 cm.  Physiologic follicles, measuring up to 1.9 cm.  Left ovary: Measures 3.0 x 1.6 x 3.3 cm.  Physiologic follicles.  Other findings: There is a small amount offree fluid.  Arterial and venous wave forms and color Doppler flow documented to both ovaries.  IMPRESSION: Small amount of free fluid, often physiologic.  Color Doppler flow with arterial and venous wave forms documented to bilateral normal sized ovaries.  Original Report Authenticated By: Waneta Martins, M.D.   US Abdomen Limited  10/11/2011  *RADIOLOGY REPORT*  Clinical Data: Vaginal bleeding, pelvic pain.  LIMITED ABDOMINAL ULTRASOUND  Comparison:  10/10/2011 CT  Findings: The appendix is not visualized by sonographic technique.  IMPRESSION: Appendix not visualized.  Original Report Authenticated By: Waneta Martins, M.D.   [place] Art/ven Flow Abd Pelv Doppler  10/11/2011  *RADIOLOGY REPORT*  Clinical Data: Pelvic pain, bleeding.  TRANSABDOMINAL AND TRANSVAGINAL ULTRASOUND OF PELVIS Technique:  Both transabdominal and transvaginal ultrasound examinations of the pelvis were performed. Transabdominal technique was performed for global imaging of the pelvis including uterus, ovaries, adnexal regions, and pelvic cul-de-sac.  Comparison: 10/10/2011 CT   It was necessary to proceed with endovaginal exam following the transabdominal exam to visualize the endometrium and adnexa.  Findings:  Uterus: Retroverted, normal appearance.  Measures 8.0 x 4.5 x 4.1 cm.  Endometrium:  Normal appearance and thickness, measuring 8 mm.  Right ovary:  Measures 4.0 x 2.1 x 2.6 cm.  Physiologic follicles, measuring up to 1.9 cm.  Left ovary: Measures 3.0 x 1.6 x 3.3 cm.  Physiologic follicles.  Other findings: There is a small amount offree fluid.  Arterial and venous wave forms and color Doppler flow documented to both ovaries.  IMPRESSION: Small amount of  free fluid, often physiologic.  Color Doppler flow with arterial and venous wave forms documented to bilateral normal sized ovaries.  Original Report Authenticated By: Waneta Martins, M.D.     1. Abdominal pain       MDM  Notified Dr. Dwain Sarna who is coming to see pt        Rolan Bucco, MD 10/13/11 [phone]

## 2011-10-12 NOTE — ED Notes (Signed)
Pt was seen by MD Annabell Howells and pt sent to see [facility] in ER. This is the 3rd visit weekend.  Pt had pain in upper stomach Thursday and moved to lower abdomen on Friday.  Pt has had nausea without vomiting and diarrhea initially, but none today or yesterday.  Pt was seen by MD Annabell Howells for blood in urine.  MD could not find cause for blood in urine.  MD Annabell Howells looked at CT scan and wanted pt evaluated for appendicitis.  Pt describes pain as located in central lower abdomen that is constant, but has periods of being sharp.  Pt has had Zofran at 0530 AM and oxycodone x2 for pain -1300PM today.  Pt reports pain to be between a 5 or 6 since she had the oxycodone.

## 2011-10-12 NOTE — Telephone Encounter (Signed)
Patient states that she was seen in the E.R Saturday for severe periumbilical abdominal pain radiating to the left as well as blood in the stool. She was told to follow up with GI Monday. Unfortunately, we have no openings for Monday. Patient can come for appt with Mike Gip, PA-C on Tuesday, 10/13/11 @ 8:30 am. She prefers to have Dr Juanda Chance as her primary GI physician (she has never seen GI Dr before).

## 2011-10-12 NOTE — ED Notes (Signed)
Lab in to draw blood.

## 2011-10-13 ENCOUNTER — Observation Stay (HOSPITAL_COMMUNITY): Payer: 59 | Admitting: Anesthesiology

## 2011-10-13 ENCOUNTER — Encounter (HOSPITAL_COMMUNITY): Payer: Self-pay | Admitting: Anesthesiology

## 2011-10-13 ENCOUNTER — Encounter (HOSPITAL_COMMUNITY)
Admission: EM | Disposition: A | Discharge status: Home / Self Care | Payer: Self-pay | Source: Home / Self Care | Attending: Emergency Medicine

## 2011-10-13 ENCOUNTER — Ambulatory Visit: Payer: 59 | Admitting: Physician Assistant

## 2011-10-13 HISTORY — PX: LAPAROSCOPIC APPENDECTOMY: SHX408

## 2011-10-13 HISTORY — PX: APPENDECTOMY: SHX54

## 2011-10-13 HISTORY — PX: LAPAROSCOPY: SHX197

## 2011-10-13 LAB — COMPREHENSIVE METABOLIC PANEL
Albumin: 3.2 g/dL — ABNORMAL LOW (ref 3.5–5.2)
BUN: 4 mg/dL — ABNORMAL LOW (ref 6–23)
Calcium: 8.4 mg/dL (ref 8.4–10.5)
Creatinine, Ser: 1.08 mg/dL (ref 0.50–1.10)
Potassium: 4.1 mEq/L (ref 3.5–5.1)
Total Protein: 5.8 g/dL — ABNORMAL LOW (ref 6.0–8.3)

## 2011-10-13 LAB — CBC
HCT: 32.6 % — ABNORMAL LOW (ref 36.0–46.0)
Hemoglobin: 11 g/dL — ABNORMAL LOW (ref 12.0–15.0)
MCV: 87.4 fL (ref 78.0–100.0)
RBC: 3.73 MIL/uL — ABNORMAL LOW (ref 3.87–5.11)
WBC: 6 10*3/uL (ref 4.0–10.5)

## 2011-10-13 SURGERY — LAPAROSCOPY, DIAGNOSTIC
Anesthesia: General | Site: Abdomen | Wound class: Clean Contaminated

## 2011-10-13 MED ORDER — SCOPOLAMINE 1 MG/3DAYS TD PT72
MEDICATED_PATCH | TRANSDERMAL | Status: DC | PRN
  Administered 2011-10-13: 1 via TRANSDERMAL

## 2011-10-13 MED ORDER — GLYCOPYRROLATE 0.2 MG/ML IJ SOLN
INTRAMUSCULAR | Status: DC | PRN
  Administered 2011-10-13: 0.3 mg via INTRAVENOUS

## 2011-10-13 MED ORDER — ONDANSETRON HCL 4 MG/2ML IJ SOLN
INTRAMUSCULAR | Status: DC | PRN
  Administered 2011-10-13: 4 mg via INTRAVENOUS

## 2011-10-13 MED ORDER — DEXAMETHASONE SODIUM PHOSPHATE 10 MG/ML IJ SOLN
INTRAMUSCULAR | Status: DC | PRN
  Administered 2011-10-13: 10 mg via INTRAVENOUS

## 2011-10-13 MED ORDER — LACTATED RINGERS IV SOLN
INTRAVENOUS | Status: DC | PRN
  Administered 2011-10-13: 11:00:00 via INTRAVENOUS

## 2011-10-13 MED ORDER — KETOROLAC TROMETHAMINE 30 MG/ML IJ SOLN
INTRAMUSCULAR | Status: DC | PRN
  Administered 2011-10-13: 30 mg via INTRAVENOUS

## 2011-10-13 MED ORDER — ACETAMINOPHEN 10 MG/ML IV SOLN
INTRAVENOUS | Status: DC | PRN
  Administered 2011-10-13: 1000 mg via INTRAVENOUS

## 2011-10-13 MED ORDER — MORPHINE SULFATE 2 MG/ML IJ SOLN
INTRAMUSCULAR | Status: AC
  Administered 2011-10-13: 2 mg via INTRAVENOUS
  Filled 2011-10-13: qty 1

## 2011-10-13 MED ORDER — ACETAMINOPHEN 325 MG PO TABS: 650.0000 mg | ORAL_TABLET | Freq: Four times a day (QID) | ORAL | Status: DC | PRN

## 2011-10-13 MED ORDER — SODIUM CHLORIDE 0.9 % IV SOLN
3.0000 g | Freq: Once | INTRAVENOUS | Status: DC
  Filled 2011-10-13: qty 3

## 2011-10-13 MED ORDER — SUCCINYLCHOLINE CHLORIDE 20 MG/ML IJ SOLN
INTRAMUSCULAR | Status: DC | PRN
  Administered 2011-10-13: 100 mg via INTRAVENOUS

## 2011-10-13 MED ORDER — ACETAMINOPHEN 650 MG RE SUPP: 650.0000 mg | Freq: Four times a day (QID) | RECTAL | Status: DC | PRN

## 2011-10-13 MED ORDER — LIDOCAINE HCL (CARDIAC) 20 MG/ML IV SOLN
INTRAVENOUS | Status: DC | PRN
  Administered 2011-10-13: 40 mg via INTRAVENOUS

## 2011-10-13 MED ORDER — FENTANYL CITRATE 0.05 MG/ML IJ SOLN
INTRAMUSCULAR | Status: DC | PRN
Start: 2011-10-13 — End: 2011-10-13
  Administered 2011-10-13: 100 ug via INTRAVENOUS

## 2011-10-13 MED ORDER — NORETHIN ACE-ETH ESTRAD-FE 1-20 MG-MCG PO TABS: 1.0000 | ORAL_TABLET | Freq: Every day | ORAL | Status: DC

## 2011-10-13 MED ORDER — NEOSTIGMINE METHYLSULFATE 1 MG/ML IJ SOLN
INTRAMUSCULAR | Status: DC | PRN
  Administered 2011-10-13: 2 mg via INTRAVENOUS

## 2011-10-13 MED ORDER — PROPOFOL 10 MG/ML IV EMUL
INTRAVENOUS | Status: DC | PRN
  Administered 2011-10-13: 120 mg via INTRAVENOUS

## 2011-10-13 MED ORDER — 0.9 % SODIUM CHLORIDE (POUR BTL) OPTIME
TOPICAL | Status: DC | PRN
  Administered 2011-10-13: 1000 mL

## 2011-10-13 MED ORDER — LACTATED RINGERS IV SOLN
INTRAVENOUS | Status: DC
  Administered 2011-10-13: 13:00:00 via INTRAVENOUS
  Administered 2011-10-13: 1000 mL via INTRAVENOUS

## 2011-10-13 MED ORDER — HYDROMORPHONE HCL PF 1 MG/ML IJ SOLN
0.2500 mg | INTRAMUSCULAR | Status: DC | PRN
  Administered 2011-10-13 (×2): 0.5 mg via INTRAVENOUS

## 2011-10-13 MED ORDER — BUPIVACAINE HCL (PF) 0.25 % IJ SOLN
INTRAMUSCULAR | Status: DC | PRN
  Administered 2011-10-13: 15 mL

## 2011-10-13 MED ORDER — KETOROLAC TROMETHAMINE 15 MG/ML IJ SOLN
15.0000 mg | Freq: Four times a day (QID) | INTRAMUSCULAR | Status: DC
  Administered 2011-10-13 – 2011-10-14 (×2): 15 mg via INTRAVENOUS
  Filled 2011-10-13 (×6): qty 1

## 2011-10-13 MED ORDER — MEPERIDINE HCL 50 MG/ML IJ SOLN
6.2500 mg | INTRAMUSCULAR | Status: DC | PRN
  Administered 2011-10-13: 12.5 mg via INTRAVENOUS

## 2011-10-13 MED ORDER — OXYCODONE HCL 5 MG PO TABS
5.0000 mg | ORAL_TABLET | ORAL | Status: DC | PRN
  Administered 2011-10-13 – 2011-10-14 (×2): 10 mg via ORAL
  Filled 2011-10-13 (×3): qty 2

## 2011-10-13 MED ORDER — LISDEXAMFETAMINE DIMESYLATE 50 MG PO CAPS
50.0000 mg | ORAL_CAPSULE | Freq: Every day | ORAL | Status: DC
  Filled 2011-10-13: qty 1

## 2011-10-13 MED ORDER — ROCURONIUM BROMIDE 100 MG/10ML IV SOLN
INTRAVENOUS | Status: DC | PRN
  Administered 2011-10-13: 20 mg via INTRAVENOUS

## 2011-10-13 MED ORDER — ONDANSETRON HCL 4 MG/2ML IJ SOLN
4.0000 mg | Freq: Four times a day (QID) | INTRAMUSCULAR | Status: DC | PRN
  Filled 2011-10-13: qty 2

## 2011-10-13 MED ORDER — SODIUM CHLORIDE 0.9 % IV SOLN
3.0000 g | INTRAVENOUS | Status: DC | PRN
  Administered 2011-10-13: 3 g via INTRAVENOUS

## 2011-10-13 MED ORDER — LACTATED RINGERS IR SOLN
Status: DC | PRN
  Administered 2011-10-13: 1000 mL

## 2011-10-13 MED ORDER — MIDAZOLAM HCL 5 MG/5ML IJ SOLN
INTRAMUSCULAR | Status: DC | PRN
  Administered 2011-10-13: 2 mg via INTRAVENOUS

## 2011-10-13 MED ORDER — PANTOPRAZOLE SODIUM 40 MG PO TBEC
40.0000 mg | DELAYED_RELEASE_TABLET | Freq: Every day | ORAL | Status: DC
  Administered 2011-10-13: 40 mg via ORAL
  Filled 2011-10-13 (×2): qty 1

## 2011-10-13 MED ORDER — MORPHINE SULFATE 2 MG/ML IJ SOLN
2.0000 mg | INTRAMUSCULAR | Status: DC | PRN
  Administered 2011-10-13: 2 mg via INTRAVENOUS
  Filled 2011-10-13: qty 1

## 2011-10-13 SURGICAL SUPPLY — 71 items
APPLIER CLIP 5 13 M/L LIGAMAX5 (MISCELLANEOUS)
APPLIER CLIP ROT 10 11.4 M/L (STAPLE)
BLADE EXTENDED COATED 6.5IN (ELECTRODE) IMPLANT
BLADE HEX COATED 2.75 (ELECTRODE) ×2 IMPLANT
BLADE SURG SZ10 CARB STEEL (BLADE) IMPLANT
CABLE HIGH FREQUENCY MONO STRZ (ELECTRODE) ×2 IMPLANT
CANISTER SUCTION 2500CC (MISCELLANEOUS) ×2 IMPLANT
CANNULA ENDOPATH XCEL 11M (ENDOMECHANICALS) IMPLANT
CHLORAPREP W/TINT 26ML (MISCELLANEOUS) ×2 IMPLANT
CLIP APPLIE 5 13 M/L LIGAMAX5 (MISCELLANEOUS) IMPLANT
CLIP APPLIE ROT 10 11.4 M/L (STAPLE) IMPLANT
CLOTH BEACON ORANGE TIMEOUT ST (SAFETY) ×2 IMPLANT
COVER MAYO STAND STRL (DRAPES) IMPLANT
COVER SURGICAL LIGHT HANDLE (MISCELLANEOUS) ×2 IMPLANT
CUTTER FLEX LINEAR 45M (STAPLE) ×4 IMPLANT
DECANTER SPIKE VIAL GLASS SM (MISCELLANEOUS) IMPLANT
DERMABOND ADVANCED (GAUZE/BANDAGES/DRESSINGS) ×1
DERMABOND ADVANCED .7 DNX12 (GAUZE/BANDAGES/DRESSINGS) ×1 IMPLANT
DRAPE LAPAROSCOPIC ABDOMINAL (DRAPES) ×2 IMPLANT
DRAPE WARM FLUID 44X44 (DRAPE) IMPLANT
ELECT REM PT RETURN 9FT ADLT (ELECTROSURGICAL) ×2
ELECTRODE REM PT RTRN 9FT ADLT (ELECTROSURGICAL) ×1 IMPLANT
FILTER SMOKE EVAC LAPAROSHD (FILTER) IMPLANT
GLOVE BIO SURGEON STRL SZ7 (GLOVE) ×4 IMPLANT
GLOVE BIOGEL M 7.0 STRL (GLOVE) ×2 IMPLANT
GLOVE BIOGEL PI IND STRL 7.0 (GLOVE) ×1 IMPLANT
GLOVE BIOGEL PI IND STRL 7.5 (GLOVE) ×2 IMPLANT
GLOVE BIOGEL PI INDICATOR 7.0 (GLOVE) ×1
GLOVE BIOGEL PI INDICATOR 7.5 (GLOVE) ×2
GOWN PREVENTION PLUS LG XLONG (DISPOSABLE) ×2 IMPLANT
GOWN PREVENTION PLUS XLARGE (GOWN DISPOSABLE) ×2 IMPLANT
GOWN STRL NON-REIN LRG LVL3 (GOWN DISPOSABLE) ×2 IMPLANT
GOWN STRL REIN XL XLG (GOWN DISPOSABLE) ×2 IMPLANT
KIT BASIN OR (CUSTOM PROCEDURE TRAY) ×2 IMPLANT
LIGASURE IMPACT 36 18CM CVD LR (INSTRUMENTS) IMPLANT
NS IRRIG 1000ML POUR BTL (IV SOLUTION) ×2 IMPLANT
PENCIL BUTTON HOLSTER BLD 10FT (ELECTRODE) ×2 IMPLANT
POUCH SPECIMEN RETRIEVAL 10MM (ENDOMECHANICALS) ×2 IMPLANT
RELOAD 45 VASCULAR/THIN (ENDOMECHANICALS) IMPLANT
RELOAD STAPLE TA45 3.5 REG BLU (ENDOMECHANICALS) ×2 IMPLANT
SCALPEL HARMONIC ACE (MISCELLANEOUS) ×4 IMPLANT
SET IRRIG TUBING LAPAROSCOPIC (IRRIGATION / IRRIGATOR) ×2 IMPLANT
SOLUTION ANTI FOG 6CC (MISCELLANEOUS) ×2 IMPLANT
SPONGE GAUZE 4X4 12PLY (GAUZE/BANDAGES/DRESSINGS) ×2 IMPLANT
SPONGE LAP 18X18 X RAY DECT (DISPOSABLE) IMPLANT
STAPLER VISISTAT 35W (STAPLE) IMPLANT
STRIP CLOSURE SKIN 1/2X4 (GAUZE/BANDAGES/DRESSINGS) IMPLANT
SUCTION POOLE TIP (SUCTIONS) IMPLANT
SUT MNCRL AB 4-0 PS2 18 (SUTURE) ×2 IMPLANT
SUT PDS AB 1 TP1 96 (SUTURE) IMPLANT
SUT PROLENE 2 0 KS (SUTURE) IMPLANT
SUT PROLENE 2 0 SH DA (SUTURE) IMPLANT
SUT SILK 2 0 (SUTURE)
SUT SILK 2 0 SH CR/8 (SUTURE) IMPLANT
SUT SILK 2-0 18XBRD TIE 12 (SUTURE) IMPLANT
SUT SILK 3 0 (SUTURE)
SUT SILK 3 0 SH CR/8 (SUTURE) IMPLANT
SUT SILK 3-0 18XBRD TIE 12 (SUTURE) IMPLANT
SUT VICRYL 0 ENDOLOOP (SUTURE) IMPLANT
SYS LAPSCP GELPORT 120MM (MISCELLANEOUS)
SYSTEM LAPSCP GELPORT 120MM (MISCELLANEOUS) IMPLANT
TOWEL OR 17X26 10 PK STRL BLUE (TOWEL DISPOSABLE) ×2 IMPLANT
TRAY FOLEY CATH 14FRSI W/METER (CATHETERS) ×2 IMPLANT
TRAY LAP CHOLE (CUSTOM PROCEDURE TRAY) ×2 IMPLANT
TROCAR BLADELESS OPT 5 75 (ENDOMECHANICALS) ×4 IMPLANT
TROCAR XCEL 12X100 BLDLESS (ENDOMECHANICALS) IMPLANT
TROCAR XCEL BLUNT TIP 100MML (ENDOMECHANICALS) ×2 IMPLANT
TROCAR XCEL NON-BLD 11X100MML (ENDOMECHANICALS) ×2 IMPLANT
TUBING INSUFFLATION 10FT LAP (TUBING) ×2 IMPLANT
YANKAUER SUCT BULB TIP 10FT TU (MISCELLANEOUS) IMPLANT
YANKAUER SUCT BULB TIP NO VENT (SUCTIONS) IMPLANT

## 2011-10-13 NOTE — Progress Notes (Signed)
UR complete 

## 2011-10-13 NOTE — Progress Notes (Signed)
Per patient request, glasses obtained from family and returned to patient.

## 2011-10-13 NOTE — Preoperative (Signed)
Beta Blockers   Reason not to administer Beta Blockers:Not Applicable 

## 2011-10-13 NOTE — Op Note (Signed)
Preoperative diagnosis: RLQ pain Postoperative diagnosis: Likely ruptured ovarian cyst    Appendicolith with dilated appendix, no appendicits Procedure: Diagnostic laparoscopy, appendectomy Surgeon: Dr. Harden Mo Specimen: appendix GETA EBL: minimal Complications: none Drains: none Sponge and needle count correct x2  Dispo to recovery in stable condition  Indications: 27 yof who presented with several days of abdominal pain mostly in RLQ.  She has undergone thorough evaluation with u/s, ct scan and multiple labs.she had a right ovarian cyst. She had a small amount of free fluid in her pelvis. She also had an abnormal appendix with what appeared to be a large fecalith and this was dilated as well. I admitted her overnight and watched her. She continues to have worsening pain primarily in her right lower quadrant but she also has some referred pain from her left lower quadrant upon palpation. This could be from a number of etiologies including her appendix were could be her ovarian cyst. I discussed continuing to follow where or just perform a diagnostic laparoscopy. I recommended laparoscopy due to the fact she did have an abnormal appendix on her CAT scan I could not sufficiently rule out that that was the source of her pain. We discussed the risks and benefits prior to beginning.  Procedure: After informed consent was obtained the patient was taken to the operating room. We had some difficulty getting an earring out of her ear.I took pictures of this. He also has some trouble on the floor she came down and this was somewhat bloody upon entering the operating room. Sequential compression devices were placed on her legs. She was administered 3 g of intravenous Unasyn. She was then placed under general endotracheal anesthesia without complication. Her abdomen was prepped and draped in standard sterile surgical fashion. A Foley catheter was inserted. A surgical timeout was then performed.  I  infiltrated quarter percent Marcaine below her umbilicus. I made a transverse incision with an 11 blade. I identified her fascia and incised it sharply.  I entered the peritoneum bluntly. I then placed a 0 Vicryl pursestring suture through the fascia. I inserted a Hassan trocar and insufflated the abdomen to 15 mm mercury pressure. I then inserted 25 mm trocars in the suprapubic and left lower quadrant under direct vision after infiltration with local anesthetic without complication. She was immediately noted to have some blood in her cul-de-sac. I evacuated this. I did a diagnostic laparoscopy.  She did have a right ovarian cyst but this does not look hemorrhagic or concerning at all. Her left ovary and uterus all normal. Her appendix did not have appendicitis although she had a very large fecalith and this was dilated for about a third of its length. I dissected the mesoappendix and divided this with a harmonic scalpel. I did use a stapler to remove appendix and placed in an Endo Catch bag. Again the remainder of her abdomen was viewed it was all normal. I think most likely her pain was coming from a ruptured ovarian cyst. It may be some from some component of this fecalith and appendicitis but that is not the most likely etiology. I then desufflated the abdomen and removed all trochars. I tied down my umbilical stitch and this completely obliterated the defect. I then closed these with 4-0 Monocryl and Dermabond. She tolerated this well was extubated and transferred to recovery in stable condition.

## 2011-10-13 NOTE — H&P (View-Only) (Signed)
  Subjective: Ab pain worse this am  Objective: Vital signs in last 24 hours: Temp:  [97.5 F (36.4 C)-98.9 F (37.2 C)] 98.5 F (36.9 C) (04/30 0519) Pulse Rate:  [53-71] 61  (04/30 0519) Resp:  [16-18] 18  (04/30 0519) BP: (96-119)/(54-72) 103/69 mmHg (04/30 0519) SpO2:  [95 %-100 %] 99 % (04/30 0519) Weight:  [115 lb (52.164 kg)] 115 lb (52.164 kg) (04/29 1905)    Intake/Output from previous day: 04/29 0701 - 04/30 0700 In: 462.5 [I.V.:462.5] Out: 750 [Urine:750] Intake/Output this shift:    General appearance: no distress GI: tender rlq>llq, soft  Lab Results:   Basename 10/13/11 0500 10/12/11 1850  WBC 6.0 6.6  HGB 11.0* 10.6*  HCT 32.6* 31.1*  PLT 218 PLATELET CLUMPS NOTED ON SMEAR, COUNT APPEARS ADEQUATE   BMET  Basename 10/13/11 0500 10/12/11 1850  NA 141 139  K 4.1 3.9  CL 110 109  CO2 25 20  GLUCOSE 80 61*  BUN 4* 7  CREATININE 1.08 1.09  CALCIUM 8.4 8.2*   Assessment/Plan: RLQ pain  She continues to have pain with nl lfts, lipase, nl wbc, no fever.  We discussed multiple options and I think this could still be from source like right ovarian cyst.  I am still concerned about ct appearance of appendix and I think at this point diagnostic laparoscopy with appendectomy is the best course of action.  We discussed risks associated with this including not curing her pain.    LOS: 1 day    Tihanna Goodson 10/13/2011  

## 2011-10-13 NOTE — Anesthesia Postprocedure Evaluation (Signed)
  Anesthesia Post-op Note  Patient: Ashley Hester  Procedure(s) Performed: Procedure(s) (LRB): LAPAROSCOPY DIAGNOSTIC (N/A) APPENDECTOMY LAPAROSCOPIC (N/A)  Patient Location: PACU  Anesthesia Type: General  Level of Consciousness: oriented and sedated  Airway and Oxygen Therapy: Patient Spontanous Breathing  Post-op Pain: mild  Post-op Assessment: Post-op Vital signs reviewed, Patient's Cardiovascular Status Stable, Respiratory Function Stable and Patent Airway  Post-op Vital Signs: stable  Complications: No apparent anesthesia complications

## 2011-10-13 NOTE — Anesthesia Preprocedure Evaluation (Addendum)
Anesthesia Evaluation  Patient identified by MRN, date of birth, ID band Patient awake    Reviewed: Allergy & Precautions, H&P , NPO status , Patient's Chart, lab work & pertinent test results, reviewed documented beta blocker date and time   History of Anesthesia Complications (+) PONV  Airway Mallampati: II TM Distance: >3 FB Neck ROM: Full    Dental No notable dental hx. (+) Teeth Intact   Pulmonary neg pulmonary ROS, asthma ,  Exercise induced asthma. breath sounds clear to auscultation  Pulmonary exam normal       Cardiovascular negative cardio ROS  Rhythm:Regular Rate:Normal     Neuro/Psych PSYCHIATRIC DISORDERS ADHD negative neurological ROS  negative psych ROS   GI/Hepatic Neg liver ROS, appendicitis   Endo/Other  hypogylcemia  Renal/GU negative Renal ROS  negative genitourinary   Musculoskeletal negative musculoskeletal ROS (+)   Abdominal   Peds negative pediatric ROS (+)  Hematology Anemia, Hgb 11.0   Anesthesia Other Findings   Reproductive/Obstetrics negative OB ROS                        Anesthesia Physical Anesthesia Plan  ASA: I  Anesthesia Plan: General   Post-op Pain Management:    Induction: Intravenous, Rapid sequence and Cricoid pressure planned  Airway Management Planned: Oral ETT  Additional Equipment:   Intra-op Plan:   Post-operative Plan: Extubation in OR  Informed Consent: I have reviewed the patients History and Physical, chart, labs and discussed the procedure including the risks, benefits and alternatives for the proposed anesthesia with the patient or authorized representative who has indicated his/her understanding and acceptance.   Dental advisory given  Plan Discussed with: CRNA and Surgeon  Anesthesia Plan Comments:        Anesthesia Quick Evaluation

## 2011-10-13 NOTE — Interval H&P Note (Signed)
History and Physical Interval Note:  10/13/2011 11:09 AM  Ashley Hester  has presented today for surgery, with the diagnosis of appendicitis  The various methods of treatment have been discussed with the patient and family. After consideration of risks, benefits and other options for treatment, the patient has consented to  Procedure(s) (LRB): LAPAROSCOPY DIAGNOSTIC (N/A) APPENDECTOMY LAPAROSCOPIC (N/A) as a surgical intervention .  The patients' history has been reviewed, patient examined, no change in status, stable for surgery.  I have reviewed the patients' chart and labs.  Questions were answered to the patient's satisfaction.     Mckenzi Buonomo

## 2011-10-13 NOTE — Progress Notes (Signed)
  Subjective: Ab pain worse this am  Objective: Vital signs in last 24 hours: Temp:  [97.5 F (36.4 C)-98.9 F (37.2 C)] 98.5 F (36.9 C) (04/30 0519) Pulse Rate:  [53-71] 61  (04/30 0519) Resp:  [16-18] 18  (04/30 0519) BP: (96-119)/(54-72) 103/69 mmHg (04/30 0519) SpO2:  [95 %-100 %] 99 % (04/30 0519) Weight:  [115 lb (52.164 kg)] 115 lb (52.164 kg) (04/29 1905)    Intake/Output from previous day: 04/29 0701 - 04/30 0700 In: 462.5 [I.V.:462.5] Out: 750 [Urine:750] Intake/Output this shift:    General appearance: no distress GI: tender rlq>llq, soft  Lab Results:   Basename 10/13/11 0500 10/12/11 1850  WBC 6.0 6.6  HGB 11.0* 10.6*  HCT 32.6* 31.1*  PLT 218 PLATELET CLUMPS NOTED ON SMEAR, COUNT APPEARS ADEQUATE   BMET  Basename 10/13/11 0500 10/12/11 1850  NA 141 139  K 4.1 3.9  CL 110 109  CO2 25 20  GLUCOSE 80 61*  BUN 4* 7  CREATININE 1.08 1.09  CALCIUM 8.4 8.2*   Assessment/Plan: RLQ pain  She continues to have pain with nl lfts, lipase, nl wbc, no fever.  We discussed multiple options and I think this could still be from source like right ovarian cyst.  I am still concerned about ct appearance of appendix and I think at this point diagnostic laparoscopy with appendectomy is the best course of action.  We discussed risks associated with this including not curing her pain.    LOS: 1 day    [facility] 10/13/2011

## 2011-10-13 NOTE — Transfer of Care (Signed)
Immediate Anesthesia Transfer of Care Note  Patient: Ashley Hester  Procedure(s) Performed: Procedure(s) (LRB): LAPAROSCOPY DIAGNOSTIC (N/A) APPENDECTOMY LAPAROSCOPIC (N/A)  Patient Location: PACU  Anesthesia Type: General  Level of Consciousness: awake, alert , oriented and patient cooperative  Airway & Oxygen Therapy: Patient Spontanous Breathing and Patient connected to face mask oxygen  Post-op Assessment: Report given to PACU RN, Post -op Vital signs reviewed and stable and Patient moving all extremities X 4  Post vital signs: Reviewed and stable  Complications: No apparent anesthesia complications

## 2011-10-14 ENCOUNTER — Other Ambulatory Visit (INDEPENDENT_AMBULATORY_CARE_PROVIDER_SITE_OTHER): Payer: Self-pay | Admitting: General Surgery

## 2011-10-14 ENCOUNTER — Encounter (HOSPITAL_COMMUNITY): Payer: Self-pay | Admitting: General Surgery

## 2011-10-14 MED ORDER — IBUPROFEN 400 MG PO TABS: 400.0000 mg | ORAL_TABLET | Freq: Three times a day (TID) | ORAL | Status: DC

## 2011-10-14 MED ORDER — OXYCODONE-ACETAMINOPHEN 5-325 MG PO TABS: 1.0000 | ORAL_TABLET | ORAL | Status: AC | PRN

## 2011-10-14 MED FILL — Hydrogen Peroxide Soln 3%: CUTANEOUS | Qty: 473 | Status: AC

## 2011-10-14 MED FILL — Bacitracin Zinc Oint 500 Unit/GM: CUTANEOUS | Qty: 15 | Status: AC

## 2011-10-14 NOTE — Discharge Instructions (Signed)
[facility] LAPAROSCOPIC SURGERY: POST OP INSTRUCTIONS  Always review your discharge instruction sheet given to you by the facility where your surgery was performed. IF YOU HAVE DISABILITY OR FAMILY LEAVE FORMS, YOU MUST BRING THEM TO THE OFFICE FOR PROCESSING.   DO NOT GIVE THEM TO YOUR DOCTOR.  1. A prescription for pain medication may be given to you upon discharge.  Take your pain medication as prescribed, if needed.  If narcotic pain medicine is not needed, then you may take acetaminophen (Tylenol), naprosyn (Alleve), or ibuprofen (Advil) as needed. 2. Take your usually prescribed medications unless otherwise directed. 3. If you need a refill on your pain medication, please contact your pharmacy.  They will contact our office to request authorization. Prescriptions will not be filled after 5pm or on week-ends. 4. You should follow a light diet the first few days after arrival home, such as soup and crackers, etc.  Be sure to include lots of fluids daily. 5. Most patients will experience some swelling and bruising in the area of the incisions.  Ice packs will help.  Swelling and bruising can take several days to resolve.  6. It is common to experience some constipation if taking pain medication after surgery.  Increasing fluid intake and taking a stool softener (such as Colace) will usually help or prevent this problem from occurring.  A mild laxative (Milk of Magnesia or Miralax) should be taken according to package instructions if there are no bowel movements after 48 hours. 7. Unless discharge instructions indicate otherwise, you may remove your bandages 48 hours after surgery, and you may shower at that time.  You may have steri-strips (small skin tapes) in place directly over the incision.  These strips should be left on the skin for 7-10 days.  If your surgeon used skin glue on the incision, you may shower in 24 hours.  The glue will flake  off over the next 2-3 weeks.  Any sutures or staples will be removed at the office during your follow-up visit. 8. ACTIVITIES:  You may resume regular (light) daily activities beginning the next day--such as daily self-care, walking, climbing stairs--gradually increasing activities as tolerated.  You may have sexual intercourse when it is comfortable.  Refrain from any heavy lifting or straining until approved by your doctor. a. You may drive when you are no longer taking prescription pain medication, you can comfortably wear a seatbelt, and you can safely maneuver your car and apply brakes. b. RETURN TO WORK:  __________________________________________________________ 9. You should see your doctor in the office for a follow-up appointment approximately 2-3 weeks after your surgery.  Make sure that you call for this appointment within a day or two after you arrive home to insure a convenient appointment time. 10. OTHER INSTRUCTIONS: __________________________________________________________________________________________________________________________ __________________________________________________________________________________________________________________________ WHEN TO CALL YOUR DOCTOR: 1. Fever over 101.0 2. Inability to urinate 3. Continued bleeding from incision. 4. Increased pain, redness, or drainage from the incision. 5. Increasing abdominal pain  The clinic staff is available to answer your questions during regular business hours.  Please don't hesitate to call and ask to speak to one of the nurses for clinical concerns.  If you have a medical emergency, go to the nearest emergency room or call 911.  A surgeon from [facility] is always on call at the hospital. [address], [address], [place], [place]  [address] ? [address] [address], [place], [place]   [address] [phone] ? [phone] ? [phone]   [phone] Web site: [email or web address]  

## 2011-10-14 NOTE — Progress Notes (Signed)
Pt discharged to home with family provided discharge instructions and prescriptions along with handouts. Pt verbalized understanding of discharge information. Pt stable. Pt transported by tech IV removed and documented. Annitta Needs, RN

## 2011-10-14 NOTE — Progress Notes (Signed)
1 Day Post-Op  Subjective: Sleeping, no real complaints  Objective: Vital signs in last 24 hours: Temp:  [97.7 F (36.5 C)-98.8 F (37.1 C)] 98.4 F (36.9 C) (05/01 0617) Pulse Rate:  [55-78] 63  (05/01 0617) Resp:  [12-18] 16  (05/01 0617) BP: (90-116)/(52-75) 90/55 mmHg (05/01 0617) SpO2:  [96 %-100 %] 96 % (05/01 0617)    Intake/Output from previous day: 04/30 0701 - 05/01 0700 In: 1100 [I.V.:1100] Out: 1410 [Urine:1400; Blood:10] Intake/Output this shift:    GI: soft, approp tender, wounds clean  Lab Results:   Basename 10/13/11 0500 10/12/11 1850  WBC 6.0 6.6  HGB 11.0* 10.6*  HCT 32.6* 31.1*  PLT 218 PLATELET CLUMPS NOTED ON SMEAR, COUNT APPEARS ADEQUATE   BMET  Basename 10/13/11 0500 10/12/11 1850  NA 141 139  K 4.1 3.9  CL 110 109  CO2 25 20  GLUCOSE 80 61*  BUN 4* 7  CREATININE 1.08 1.09  CALCIUM 8.4 8.2*   PT/INR No results found for this basename: LABPROT:2,INR:2 in the last 72 hours ABG No results found for this basename: PHART:2,PCO2:2,PO2:2,HCO3:2 in the last 72 hours  Studies/Results: No results found.  Anti-infectives: Anti-infectives     Start     Dose/Rate Route Frequency Ordered Stop   10/13/11 1200   Ampicillin-Sulbactam (UNASYN) 3 g in sodium chloride 0.9 % 100 mL IVPB  Status:  Discontinued        3 g 100 mL/hr over 60 Minutes Intravenous  Once 10/13/11 1109 10/13/11 1350          Assessment/Plan: POD 1 lap appy, dx lsc  I think primary source of pain was likely ruptured hemorrhagic ovarian cyst but appendix was not normal either. Will discharge home today   LOS: 2 days    [facility] 10/14/2011

## 2011-10-14 NOTE — Discharge Summary (Signed)
Physician Discharge Summary  Patient ID: ABIMBOLA AKI MRN: [id number] DOB/AGE: [birth year] 19 y.o.  Admit date: 10/12/2011 Discharge date: 10/14/2011  Admission Diagnoses: Abdominal pain  Discharge Diagnoses:  S/p lap appy, likely ovarian cyst  Discharged Condition: good  Hospital Course: 69 yof with history of abdominal pain and abnormal ct scan. Diagnostic laparoscopy performed which had some hemorrhagic fluid in pelvis.  Appendix was also not normal but not appendicitis.  I think pain was likely from ruptured ovarian cyst.  Appendectomy was performed.  She has done well and will be discharged home.  Consults: None  Significant Diagnostic Studies: none  Treatments: surgery: diagnostic laparoscopy and appendectomy    Disposition: 01-Home or Self Care   Medication List  As of 10/14/2011  7:57 AM   STOP taking these medications         dicyclomine 20 MG tablet      omeprazole 20 MG capsule      ondansetron 4 MG tablet         TAKE these medications         ibuprofen 400 MG tablet   Commonly known as: ADVIL,MOTRIN   Take 1 tablet (400 mg total) by mouth every 8 (eight) hours. For pain relief      lisdexamfetamine 50 MG capsule   Commonly known as: VYVANSE   Take 50 mg by mouth every morning.      norethindrone-ethinyl estradiol 1-20 MG-MCG tablet   Commonly known as: JUNEL FE,GILDESS FE,LOESTRIN FE   Take 1 tablet by mouth daily.      oxyCODONE-acetaminophen 5-325 MG per tablet   Commonly known as: PERCOCET   Take 2 tablets by mouth every 4 (four) hours as needed for pain.      oxyCODONE-acetaminophen 5-325 MG per tablet   Commonly known as: PERCOCET   Take 1 tablet by mouth every 4 (four) hours as needed for pain.           Follow-up Information    Follow up with [facility], MD. Schedule an appointment as soon as possible for a visit in 3 weeks.   Contact information:   [employer] [address] [place] [address] [phone]          Signed: Emelia Loron 10/14/2011, 7:57 AM

## 2011-10-16 LAB — MRSA CULTURE

## 2011-11-12 ENCOUNTER — Ambulatory Visit (INDEPENDENT_AMBULATORY_CARE_PROVIDER_SITE_OTHER): Payer: 59 | Admitting: General Surgery

## 2011-11-12 ENCOUNTER — Encounter (INDEPENDENT_AMBULATORY_CARE_PROVIDER_SITE_OTHER): Payer: Self-pay | Admitting: General Surgery

## 2011-11-12 VITALS — BP 110/72 | HR 63 | Temp 97.8°F | Ht 63.5 in | Wt 111.6 lb

## 2011-11-12 DIAGNOSIS — Z09 Encounter for follow-up examination after completed treatment for conditions other than malignant neoplasm: Secondary | ICD-10-CM

## 2011-11-12 NOTE — Progress Notes (Signed)
Subjective:     Patient ID: Ashley Hester, female   DOB: 1990-10-09, 21 y.o.   MRN: [id number]  HPI 21 yof who had rlq pain and that I ended up taking her to or for dx lsc.  She had what appeared to be ruptured hemorrhagic ovarian cyst on right with blood in her pelvis.  Her appendix was also abnl on ct and on laparoscopy.  I removed it as well.  She returns today doing well without any complaints.  Review of Systems     Objective:   Physical Exam Well healed incisions without infection    Assessment:     S/p lap appy    Plan:     Return to normal activity and see me as needed

## 2012-09-18 ENCOUNTER — Emergency Department (HOSPITAL_COMMUNITY)
Admission: EM | Admit: 2012-09-18 | Discharge: 2012-09-18 | Disposition: A | Discharge status: Home / Self Care | Payer: No Typology Code available for payment source | Source: Home / Self Care | Attending: Emergency Medicine | Admitting: Emergency Medicine

## 2012-09-18 ENCOUNTER — Encounter (HOSPITAL_COMMUNITY): Payer: Self-pay | Admitting: Emergency Medicine

## 2012-09-18 DIAGNOSIS — N390 Urinary tract infection, site not specified: Secondary | ICD-10-CM

## 2012-09-18 LAB — POCT URINALYSIS DIP (DEVICE)
Ketones, ur: NEGATIVE mg/dL
Protein, ur: 30 mg/dL — AB
pH: 6 (ref 5.0–8.0)

## 2012-09-18 MED ORDER — CEPHALEXIN 500 MG PO CAPS: 500.0000 mg | ORAL_CAPSULE | Freq: Four times a day (QID) | ORAL | Status: DC

## 2012-09-18 MED ORDER — IBUPROFEN 800 MG PO TABS: 800.0000 mg | ORAL_TABLET | Freq: Three times a day (TID) | ORAL | Status: DC

## 2012-09-18 NOTE — ED Provider Notes (Signed)
Medical screening examination/treatment/procedure(s) were performed by non-physician practitioner and as supervising physician I was immediately available for consultation/collaboration.  Leslee Home, M.D.  Reuben Likes, MD 09/18/12 [phone]

## 2012-09-18 NOTE — ED Notes (Signed)
Right flank pain, hurts with breathing, sneezing, coughing, and never goes away completely, sometimes varying in severity.  No pain with urination, but pain into back seems to worsen after emptying bladder

## 2012-09-18 NOTE — ED Provider Notes (Signed)
History     CSN: [id number]  Arrival date & time 09/18/12  1101   First MD Initiated Contact with Patient 09/18/12 1127      Chief Complaint  Patient presents with  . Flank Pain    (Consider location/radiation/quality/duration/timing/severity/associated sxs/prior treatment) Patient is a 22 y.o. female presenting with flank pain. The history is provided by the patient. No language interpreter was used.  Flank Pain This is a new problem. The current episode started 2 days ago. The problem occurs constantly. The problem has been gradually worsening. Pertinent negatives include no abdominal pain. The symptoms are aggravated by coughing. Nothing relieves the symptoms. She has tried nothing for the symptoms.    Past Medical History  Diagnosis Date  . ADHD (attention deficit hyperactivity disorder)   . Asthma     sport induced  . PONV (postoperative nausea and vomiting)     Hard to wake up    Past Surgical History  Procedure Laterality Date  . [place] teeth extraction    . Laparoscopy  10/13/2011    Procedure: LAPAROSCOPY DIAGNOSTIC;  Surgeon: Emelia Loron, MD;  Location: [facility];  Service: General;  Laterality: N/A;  . Laparoscopic appendectomy  10/13/2011    Procedure: APPENDECTOMY LAPAROSCOPIC;  Surgeon: Emelia Loron, MD;  Location: [facility];  Service: General;  Laterality: N/A;  . Appendectomy  [id number]    Family History  Problem Relation Age of Onset  . Crohn's disease Maternal Grandmother   . Cancer Maternal Grandmother   . Hypertension Mother   . Cancer Paternal Uncle     brain/lung    History  Substance Use Topics  . Smoking status: Never Smoker   . Smokeless tobacco: Never Used  . Alcohol Use: No    OB History   Grav Para Term Preterm Abortions TAB SAB Ect Mult Living                  Review of Systems  Gastrointestinal: Negative for abdominal pain.  Genitourinary: Positive for flank pain.  All other systems reviewed and are  negative.    Allergies  Review of patient's allergies indicates no known allergies.  Home Medications   Current Outpatient Rx  Name  Route  Sig  Dispense  Refill  . drospirenone-ethinyl estradiol (YAZ,GIANVI,LORYNA) 3-0.02 MG tablet   Oral   Take 1 tablet by mouth daily.         Marland Kitchen amoxicillin-clavulanate (AUGMENTIN) 875-125 MG per tablet               . benzonatate (TESSALON) 100 MG capsule               . ibuprofen (ADVIL,MOTRIN) 400 MG tablet   Oral   Take 1 tablet (400 mg total) by mouth every 8 (eight) hours. For pain relief   30 tablet   1     Take this for 5 days continuously then take as nee ...   . lisdexamfetamine (VYVANSE) 50 MG capsule   Oral   Take 50 mg by mouth every morning.         Kathrynn Running Estradiol-Fe (GENERESS FE PO)   Oral   Take by mouth daily.           BP 112/81  Pulse 78  Temp(Src) 98 F (36.7 C) (Oral)  SpO2 98%  LMP 08/18/2012  Physical Exam  Nursing note and vitals reviewed. Constitutional: She appears well-developed and well-nourished.  HENT:  Head: Normocephalic and atraumatic.  Eyes: Conjunctivae and  EOM are normal. Pupils are equal, round, and reactive to light.  Neck: Normal range of motion. Neck supple.  Cardiovascular: Normal rate and normal heart sounds.   Pulmonary/Chest: Effort normal.  Abdominal: Soft.  Tender right flank  Musculoskeletal: Normal range of motion.  Neurological: She is alert.  Skin: Skin is warm.  Psychiatric: She has a normal mood and affect.    ED Course  Procedures (including critical care time)  Labs Reviewed  POCT URINALYSIS DIP (DEVICE) - Abnormal; Notable for the following:    Hgb urine dipstick SMALL (*)    Protein, ur 30 (*)    Leukocytes, UA TRACE (*)    All other components within normal limits   No results found.   No diagnosis found.    MDM  Keflex  Ibuprofen   Follow up with  Primary  For recheck in 2-3 days.          Lonia Skinner [place],  PA-C 09/18/12 1206

## 2012-09-19 LAB — URINE CULTURE: Colony Count: NO GROWTH

## 2012-09-20 ENCOUNTER — Emergency Department (HOSPITAL_COMMUNITY)
Admission: EM | Admit: 2012-09-20 | Discharge: 2012-09-21 | Disposition: A | Discharge status: Home / Self Care | Payer: No Typology Code available for payment source | Attending: Emergency Medicine | Admitting: Emergency Medicine

## 2012-09-20 ENCOUNTER — Encounter (HOSPITAL_COMMUNITY): Payer: Self-pay

## 2012-09-20 ENCOUNTER — Emergency Department (HOSPITAL_COMMUNITY): Payer: No Typology Code available for payment source

## 2012-09-20 DIAGNOSIS — N83209 Unspecified ovarian cyst, unspecified side: Secondary | ICD-10-CM | POA: Insufficient documentation

## 2012-09-20 DIAGNOSIS — Z3202 Encounter for pregnancy test, result negative: Secondary | ICD-10-CM | POA: Insufficient documentation

## 2012-09-20 DIAGNOSIS — J45909 Unspecified asthma, uncomplicated: Secondary | ICD-10-CM | POA: Insufficient documentation

## 2012-09-20 DIAGNOSIS — R319 Hematuria, unspecified: Secondary | ICD-10-CM | POA: Insufficient documentation

## 2012-09-20 DIAGNOSIS — R112 Nausea with vomiting, unspecified: Secondary | ICD-10-CM | POA: Insufficient documentation

## 2012-09-20 DIAGNOSIS — Z87440 Personal history of urinary (tract) infections: Secondary | ICD-10-CM | POA: Insufficient documentation

## 2012-09-20 DIAGNOSIS — Z8659 Personal history of other mental and behavioral disorders: Secondary | ICD-10-CM | POA: Insufficient documentation

## 2012-09-20 HISTORY — DX: Urinary tract infection, site not specified: N39.0

## 2012-09-20 LAB — URINALYSIS, ROUTINE W REFLEX MICROSCOPIC
Glucose, UA: NEGATIVE mg/dL
Protein, ur: NEGATIVE mg/dL
Urobilinogen, UA: 0.2 mg/dL (ref 0.0–1.0)

## 2012-09-20 LAB — URINE MICROSCOPIC-ADD ON

## 2012-09-20 LAB — COMPREHENSIVE METABOLIC PANEL
Albumin: 3.6 g/dL (ref 3.5–5.2)
Alkaline Phosphatase: 45 U/L (ref 39–117)
BUN: 8 mg/dL (ref 6–23)
Calcium: 9.5 mg/dL (ref 8.4–10.5)
Creatinine, Ser: 0.9 mg/dL (ref 0.50–1.10)
GFR calc Af Amer: >90 mL/min (ref >90)
Glucose, Bld: 78 mg/dL (ref 70–99)
Potassium: 3.5 mEq/L (ref 3.5–5.1)
Sodium: 137 mEq/L (ref 135–145)
Total Protein: 7.4 g/dL (ref 6.0–8.3)

## 2012-09-20 LAB — CBC WITH DIFFERENTIAL/PLATELET
Basophils Absolute: 0 10*3/uL (ref 0.0–0.1)
HCT: 36.8 % (ref 36.0–46.0)
Lymphocytes Relative: 47 % — ABNORMAL HIGH (ref 12–46)
Neutro Abs: 3.1 10*3/uL (ref 1.7–7.7)
Neutrophils Relative %: 45 % (ref 43–77)
Platelets: 211 10*3/uL (ref 150–400)
RDW: 12.1 % (ref 11.5–15.5)
WBC: 7 10*3/uL (ref 4.0–10.5)

## 2012-09-20 LAB — POCT PREGNANCY, URINE: Preg Test, Ur: NEGATIVE

## 2012-09-20 MED ORDER — ONDANSETRON HCL 4 MG/2ML IJ SOLN
4.0000 mg | INTRAMUSCULAR | Status: AC
  Administered 2012-09-20: 4 mg via INTRAVENOUS
  Filled 2012-09-20: qty 2

## 2012-09-20 MED ORDER — HYDROMORPHONE HCL PF 1 MG/ML IJ SOLN
0.5000 mg | Freq: Once | INTRAMUSCULAR | Status: AC
  Administered 2012-09-20: 0.5 mg via INTRAVENOUS
  Filled 2012-09-20: qty 1

## 2012-09-20 MED ORDER — OXYCODONE-ACETAMINOPHEN 5-325 MG PO TABS: 1.0000 | ORAL_TABLET | ORAL | Status: DC | PRN

## 2012-09-20 MED ORDER — SODIUM CHLORIDE 0.9 % IV SOLN
Freq: Once | INTRAVENOUS | Status: AC
  Administered 2012-09-20: 23:00:00 via INTRAVENOUS

## 2012-09-20 NOTE — ED Notes (Signed)
Pt became pale and diaphoretic with c/o nausea on sitting--- BP 93/45;  PA was notified.

## 2012-09-20 NOTE — ED Notes (Signed)
Per pt, started with flank pain to right on Thursday.  Both sides with intermittent pain.  Hx of UTI.  No difficulty urinating or frequency.  [facility] and treated for UTI.  Pt still on antibiotics Keflex.

## 2012-09-20 NOTE — ED Provider Notes (Addendum)
History     CSN: [id number]  Arrival date & time 09/20/12  9562   First MD Initiated Contact with Patient 09/20/12 1954      Chief Complaint  Patient presents with  . Flank Pain    (Consider location/radiation/quality/duration/timing/severity/associated sxs/prior treatment) HPI Comments: Patient is a 22 y/o female with PMH of frequent UTIs who presents for right sided flank pain x 5 days. Patient states the pain is aching in nature, nonradiating, and waxing and waning in severity. Patient has tried ibuprofen without relief of pain. She admits to associated nausea with 1 episode of nonbloody, nonbilious emesis 2 days ago as well as an intermittent low grade temperature of ~99/100. Patient seen for this complaint in urgent care 2 days ago and was d/c with diagnosis of UTI and started on Keflex. Patient returns for evaluation today as symptoms have persisted without any improvement.  Patient is a 22 y.o. female presenting with flank pain. The history is provided by the patient. No language interpreter was used.  Flank Pain Associated symptoms include nausea and vomiting. Pertinent negatives include no abdominal pain, chest pain, chills, diaphoresis, numbness or weakness.    Past Medical History  Diagnosis Date  . ADHD (attention deficit hyperactivity disorder)   . Asthma     sport induced  . PONV (postoperative nausea and vomiting)     Hard to wake up  . UTI (lower urinary tract infection)     Past Surgical History  Procedure Laterality Date  . Wisdon teeth extraction    . Laparoscopy  10/13/2011    Procedure: LAPAROSCOPY DIAGNOSTIC;  Surgeon: Emelia Loron, MD;  Location: [facility];  Service: General;  Laterality: N/A;  . Laparoscopic appendectomy  10/13/2011    Procedure: APPENDECTOMY LAPAROSCOPIC;  Surgeon: Emelia Loron, MD;  Location: [facility];  Service: General;  Laterality: N/A;  . Appendectomy  [id number]    Family History  Problem Relation Age of Onset  . Crohn's  disease Maternal Grandmother   . Cancer Maternal Grandmother   . Hypertension Mother   . Cancer Paternal Uncle     brain/lung    History  Substance Use Topics  . Smoking status: Never Smoker   . Smokeless tobacco: Never Used  . Alcohol Use: No    OB History   Grav Para Term Preterm Abortions TAB SAB Ect Mult Living                  Review of Systems  Constitutional: Negative for chills and diaphoresis.  Eyes: Negative for visual disturbance.  Respiratory: Negative for shortness of breath.   Cardiovascular: Negative for chest pain.  Gastrointestinal: Positive for nausea and vomiting. Negative for abdominal pain and blood in stool.  Genitourinary: Positive for hematuria and flank pain. Negative for dysuria, vaginal bleeding and vaginal discharge.  Musculoskeletal: Negative for back pain.  Skin: Negative for color change.  Neurological: Negative for dizziness, syncope, weakness, light-headedness and numbness.  All other systems reviewed and are negative.    Allergies  Review of patient's allergies indicates no known allergies.  Home Medications   Current Outpatient Rx  Name  Route  Sig  Dispense  Refill  . cephALEXin (KEFLEX) 500 MG capsule   Oral   Take 1 capsule (500 mg total) by mouth 4 (four) times daily.   40 capsule   0   . drospirenone-ethinyl estradiol (YAZ,GIANVI,LORYNA) 3-0.02 MG tablet   Oral   Take 1 tablet by mouth daily.         Marland Kitchen  ibuprofen (ADVIL,MOTRIN) 800 MG tablet   Oral   Take 1 tablet (800 mg total) by mouth 3 (three) times daily.   21 tablet   0   . oxyCODONE-acetaminophen (PERCOCET/ROXICET) 5-325 MG per tablet   Oral   Take 1 tablet by mouth every 4 (four) hours as needed for pain.   10 tablet   0     BP 111/49  Pulse 76  Temp(Src) 98.2 F (36.8 C) (Oral)  Resp 18  Ht 5\' 3"  (1.6 m)  Wt 115 lb (52.164 kg)  BMI 20.38 kg/m2  SpO2 97%  LMP 08/22/2012  Physical Exam  Nursing note and vitals reviewed. Constitutional: She is  oriented to person, place, and time. She appears well-developed and well-nourished. No distress.  HENT:  Head: Normocephalic and atraumatic.  Eyes: Conjunctivae and EOM are normal. Pupils are equal, round, and reactive to light. No scleral icterus.  Neck: Normal range of motion. Neck supple.  Cardiovascular: Normal rate, regular rhythm and normal heart sounds.   Pulmonary/Chest: Effort normal and breath sounds normal. No respiratory distress. She has no wheezes. She has no rales.  Abdominal: Soft. She exhibits no fluid wave, no ascites and no mass. There is tenderness in the suprapubic area. There is CVA tenderness. There is no rigidity, no rebound, no guarding, no tenderness at McBurney's point and negative Murphy's sign.  +R CVA tendnerness and R inguinal/suprapubic TTP; no peritoneal signs or palpable masses  Musculoskeletal: Normal range of motion. She exhibits no edema.  Neurological: She is alert and oriented to person, place, and time.  Skin: Skin is warm and dry. No rash noted. She is not diaphoretic. No erythema.  Psychiatric: She has a normal mood and affect. Her behavior is normal.    ED Course  Procedures (including critical care time)  Labs Reviewed  URINALYSIS, ROUTINE W REFLEX MICROSCOPIC - Abnormal; Notable for the following:    APPearance CLOUDY (*)    Hgb urine dipstick LARGE (*)    Leukocytes, UA SMALL (*)    All other components within normal limits  CBC WITH DIFFERENTIAL - Abnormal; Notable for the following:    Lymphocytes Relative 47 (*)    All other components within normal limits  COMPREHENSIVE METABOLIC PANEL - Abnormal; Notable for the following:    Total Bilirubin 0.2 (*)    All other components within normal limits  URINE MICROSCOPIC-ADD ON - Abnormal; Notable for the following:    Squamous Epithelial / LPF FEW (*)    Bacteria, UA FEW (*)    All other components within normal limits  URINE CULTURE  POCT PREGNANCY, URINE   Ct Abdomen Pelvis Wo  Contrast  09/20/2012  *RADIOLOGY REPORT*  Clinical Data: New onset of flank pain.  CT ABDOMEN AND PELVIS WITHOUT CONTRAST  Technique:  Multidetector CT imaging of the abdomen and pelvis was performed following the standard protocol without intravenous contrast.  Comparison: CT abdomen and pelvis 10/10/2011.  Findings: The lung bases are clear without focal nodule, mass, or airspace disease.  The heart size is normal.  No significant pleural or pericardial effusion is present.   The liver and spleen are within normal limits.  The stomach, duodenum, and pancreas are unremarkable.  The common bile duct and gallbladder are normal.  The adrenal glands are normal bilaterally. The kidneys ureters are unremarkable.  There is no evidence for nephrolithiasis or hydronephrosis.  A dominant follicle in the right ovary measures 1.9 cm.  There is some free fluid in the  pelvis.  The uterus and left adnexa are unremarkable.  This may represent a recently ruptured follicle.  The rectosigmoid colon is within normal limits.   The remainder of the colon is unremarkable.  The appendix is surgically absent.  The bone windows are unremarkable.  IMPRESSION:  1.  Dominant follicle on the right ovary with some surrounding free fluid.  This may represent a recently ruptured follicle. 2.  No other acute or focal abnormality to explain the patient's symptoms. 3.  No significant nephrolithiasis or hydronephrosis.   Original Report Authenticated By: Marin Roberts, M.D.      Date: 09/21/2012  Rate: 88  Rhythm: normal sinus rhythm  QRS Axis: normal  Intervals: normal  ST/T Wave abnormalities: normal  Conduction Disutrbances:none  Narrative Interpretation: NSR without STEMI  Old EKG Reviewed: unchanged I have personally reviewed and interpreted this EKG   1. Ruptured ovarian cyst     MDM  Patient presents for right sided flank pain x 5 days. Seen in [facility] and d/c with dx of UTI and Keflex. Patient presents to ED as symptoms  persisting. CBC and CMP wnl and urine significant for 11-20 RBC. CT ordered to evaluate for kidney stone. Discomfort controlled by 0.5mg  IV dilaudid. Patient receiving 1L IVF bolus.  CT scan with free fluid around dominant follicle of R ovary, consistent with ruptured ovarian cyst. No nephro or ureterolithiasis seen. Patient sleeping comfortably in bed and pain remains well controlled. Patient stable for d/c with narcotics for pain control and OBGYN follow up. Indications for ED return discussed.   On discharge patient became pale, diaphoretic, and nauseous with BP systolic dropping to 90's from 110. EKG ordered for evaluation of symptoms. Second IVF bolus ordered.  EKG with NSR. Patient's BP stable with systolic between 409-811 without tachycardia. Patient's symptoms resolving and she no longer feels nauseous; symptoms likely 2/2 dilaudid. Stable for d/c home. Patient seen also by Dr. Dierdre Highman who, too, believes patient is stable for d/c.    Filed Vitals:   09/20/12 2356 09/21/12 0006 09/21/12 0015 09/21/12 0054  BP: 99/59 111/62 125/69 117/69  Pulse: 74 87 77   Temp:      TempSrc:      Resp: 18 18    Height:      Weight:      SpO2: 96% 100% 100%          Antony Madura, PA-C 09/22/12 [address] PA-C 10/12/12 [phone]

## 2012-09-21 LAB — URINE CULTURE: Culture: NO GROWTH

## 2012-09-21 MED ORDER — ONDANSETRON HCL 4 MG/2ML IJ SOLN
4.0000 mg | Freq: Once | INTRAMUSCULAR | Status: AC
  Administered 2012-09-21: 4 mg via INTRAVENOUS
  Filled 2012-09-21: qty 2

## 2012-09-24 NOTE — ED Provider Notes (Signed)
Medical screening examination/treatment/procedure(s) were performed by non-physician practitioner and as supervising physician I was immediately available for consultation/collaboration.  Cynthis Purington, MD 09/24/12 0004 

## 2012-10-13 NOTE — ED Provider Notes (Signed)
Medical screening examination/treatment/procedure(s) were performed by non-physician practitioner and as supervising physician I was immediately available for consultation/collaboration.  Sunnie Nielsen, MD 10/13/12 [phone]

## 2014-06-14 ENCOUNTER — Other Ambulatory Visit (HOSPITAL_COMMUNITY)
Admission: RE | Admit: 2014-06-14 | Discharge: 2014-06-14 | Disposition: A | Discharge status: Home / Self Care | Payer: BLUE CROSS/BLUE SHIELD | Source: Ambulatory Visit | Attending: Obstetrics & Gynecology | Admitting: Obstetrics & Gynecology

## 2014-06-14 ENCOUNTER — Other Ambulatory Visit: Payer: Self-pay | Admitting: Obstetrics & Gynecology

## 2014-06-14 DIAGNOSIS — Z01419 Encounter for gynecological examination (general) (routine) without abnormal findings: Secondary | ICD-10-CM | POA: Insufficient documentation

## 2014-06-18 LAB — CYTOLOGY - PAP

## 2014-07-14 ENCOUNTER — Encounter (HOSPITAL_COMMUNITY): Payer: Self-pay | Admitting: *Deleted

## 2014-07-14 ENCOUNTER — Inpatient Hospital Stay (HOSPITAL_COMMUNITY)
Admission: AD | Admit: 2014-07-14 | Discharge: 2014-07-14 | Disposition: A | Discharge status: Home / Self Care | Payer: BLUE CROSS/BLUE SHIELD | Source: Ambulatory Visit | Attending: Obstetrics & Gynecology | Admitting: Obstetrics & Gynecology

## 2014-07-14 ENCOUNTER — Inpatient Hospital Stay (HOSPITAL_COMMUNITY): Payer: BLUE CROSS/BLUE SHIELD

## 2014-07-14 DIAGNOSIS — N949 Unspecified condition associated with female genital organs and menstrual cycle: Secondary | ICD-10-CM | POA: Insufficient documentation

## 2014-07-14 DIAGNOSIS — N390 Urinary tract infection, site not specified: Secondary | ICD-10-CM | POA: Diagnosis not present

## 2014-07-14 DIAGNOSIS — R102 Pelvic and perineal pain: Secondary | ICD-10-CM

## 2014-07-14 LAB — POCT PREGNANCY, URINE: PREG TEST UR: NEGATIVE

## 2014-07-14 LAB — URINALYSIS, ROUTINE W REFLEX MICROSCOPIC
BILIRUBIN URINE: NEGATIVE
GLUCOSE, UA: NEGATIVE mg/dL
HGB URINE DIPSTICK: NEGATIVE
KETONES UR: NEGATIVE mg/dL
NITRITE: POSITIVE — AB
PH: 6.5 (ref 5.0–8.0)
PROTEIN: NEGATIVE mg/dL
Specific Gravity, Urine: 1.01 (ref 1.005–1.030)
Urobilinogen, UA: 0.2 mg/dL (ref 0.0–1.0)

## 2014-07-14 LAB — URINE MICROSCOPIC-ADD ON

## 2014-07-14 MED ORDER — KETOROLAC TROMETHAMINE 60 MG/2ML IM SOLN
60.0000 mg | Freq: Once | INTRAMUSCULAR | Status: AC
  Administered 2014-07-14: 60 mg via INTRAMUSCULAR

## 2014-07-14 MED ORDER — PHENAZOPYRIDINE HCL 200 MG PO TABS: 200.0000 mg | ORAL_TABLET | Freq: Three times a day (TID) | ORAL | Status: DC

## 2014-07-14 MED ORDER — SULFAMETHOXAZOLE-TRIMETHOPRIM 800-160 MG PO TABS: 1.0000 | ORAL_TABLET | Freq: Two times a day (BID) | ORAL | Status: AC

## 2014-07-14 MED ORDER — PHENAZOPYRIDINE HCL 100 MG PO TABS
200.0000 mg | ORAL_TABLET | Freq: Once | ORAL | Status: AC
  Administered 2014-07-14: 200 mg via ORAL
  Filled 2014-07-14: qty 2

## 2014-07-14 MED ORDER — FLUCONAZOLE 150 MG PO TABS: 150.0000 mg | ORAL_TABLET | Freq: Every day | ORAL | Status: DC

## 2014-07-14 NOTE — MAU Provider Note (Signed)
History     CSN: [id number]  Arrival date and time: 07/14/14 [id number]   First Provider Initiated Contact with Patient 07/14/14 0147      Chief Complaint  Patient presents with  . Pelvic Pain   HPI    [name] is a 24 y.o. female [id number] who presents with pelvic pain. The pain is located in the middle of her lower stomach. The pain started this morning at 0830; it felt at that time like a UTI.   She had an IUD placed 3 weeks ago and continues to have light spotting.   OB History    Gravida Para Term Preterm AB TAB SAB Ectopic Multiple Living   0 0 0 0 0 0 0 0 0 0       Past Medical History  Diagnosis Date  . ADHD (attention deficit hyperactivity disorder)   . Asthma     sport induced  . PONV (postoperative nausea and vomiting)     Hard to wake up  . UTI (lower urinary tract infection)     Past Surgical History  Procedure Laterality Date  . [place] teeth extraction    . Laparoscopy  10/13/2011    Procedure: LAPAROSCOPY DIAGNOSTIC;  Surgeon: Emelia LoronMatthew Wakefield, MD;  Location: [facility];  Service: General;  Laterality: N/A;  . Laparoscopic appendectomy  10/13/2011    Procedure: APPENDECTOMY LAPAROSCOPIC;  Surgeon: Emelia LoronMatthew Wakefield, MD;  Location: [facility];  Service: General;  Laterality: N/A;  . Appendectomy  [id number]    Family History  Problem Relation Age of Onset  . Crohn's disease Maternal Grandmother   . Cancer Maternal Grandmother   . Hypertension Mother   . Cancer Paternal Uncle     brain/lung    History  Substance Use Topics  . Smoking status: Never Smoker   . Smokeless tobacco: Never Used  . Alcohol Use: No    Allergies: No Known Allergies  Prescriptions prior to admission  Medication Sig Dispense Refill Last Dose  . ibuprofen (ADVIL,MOTRIN) 800 MG tablet Take 1 tablet (800 mg total) by mouth 3 (three) times daily. 21 tablet 0 07/13/2014 at 2230  . cephALEXin (KEFLEX) 500 MG capsule Take 1 capsule (500 mg total) by mouth 4 (four) times daily. 40  capsule 0 09/20/2012 at Unknown  . drospirenone-ethinyl estradiol (YAZ,GIANVI,LORYNA) 3-0.02 MG tablet Take 1 tablet by mouth daily.   09/20/2012 at Unknown  . oxyCODONE-acetaminophen (PERCOCET/ROXICET) 5-325 MG per tablet Take 1 tablet by mouth every 4 (four) hours as needed for pain. 10 tablet 0    Results for orders placed or performed during the hospital encounter of 07/14/14 (from the past 48 hour(s))  Urinalysis, Routine w reflex microscopic     Status: Abnormal   Collection Time: 07/14/14 12:50 AM  Result Value Ref Range   Color, Urine YELLOW YELLOW   APPearance CLEAR CLEAR   Specific Gravity, Urine 1.010 1.005 - 1.030   pH 6.5 5.0 - 8.0   Glucose, UA NEGATIVE NEGATIVE mg/dL   Hgb urine dipstick NEGATIVE NEGATIVE   Bilirubin Urine NEGATIVE NEGATIVE   Ketones, ur NEGATIVE NEGATIVE mg/dL   Protein, ur NEGATIVE NEGATIVE mg/dL   Urobilinogen, UA 0.2 0.0 - 1.0 mg/dL   Nitrite POSITIVE (A) NEGATIVE   Leukocytes, UA SMALL (A) NEGATIVE  Urine microscopic-add on     Status: Abnormal   Collection Time: 07/14/14 12:50 AM  Result Value Ref Range   Squamous Epithelial / LPF RARE RARE   WBC, UA 7-10 <3 WBC/hpf  RBC / HPF 0-2 <3 RBC/hpf   Bacteria, UA MANY (A) RARE  Pregnancy, urine POC     Status: None   Collection Time: 07/14/14  1:15 AM  Result Value Ref Range   Preg Test, Ur NEGATIVE NEGATIVE    Comment:        THE SENSITIVITY OF THIS METHODOLOGY IS >24 mIU/mL     US Transvaginal Non-ob  07/14/2014   CLINICAL DATA:  Pelvic pain for 2 days.  Previous appendectomy.  EXAM: TRANSABDOMINAL AND TRANSVAGINAL ULTRASOUND OF PELVIS  TECHNIQUE: Both transabdominal and transvaginal ultrasound examinations of the pelvis were performed. Transabdominal technique was performed for global imaging of the pelvis including uterus, ovaries, adnexal regions, and pelvic cul-de-sac. It was necessary to proceed with endovaginal exam following the transabdominal exam to visualize the uterus and ovaries.   COMPARISON:  CT abdomen and pelvis 09/20/2012  FINDINGS: Uterus  Measurements: 7.4 x 3.8 x 4.9 cm, retroverted. No fibroids or other mass visualized.  Endometrium  Thickness: 4 mm.  Intrauterine device is present.  Right ovary  Measurements: 4 x 2 x 2.9 cm. Normal appearance/no adnexal mass.  Left ovary  Measurements: 3 x 1.4 x 2 cm. Normal appearance/no adnexal mass.  Other findings  Small lung free fluid in the pelvis.  IMPRESSION: Intrauterine device is in place. Uterus and ovaries are otherwise normal. Small amount of free fluid in the pelvis is probably physiologic.   Electronically Signed   By: Burman Nieves M.D.   On: 07/14/2014 02:55   US Pelvis Complete  07/14/2014   CLINICAL DATA:  Pelvic pain for 2 days.  Previous appendectomy.  EXAM: TRANSABDOMINAL AND TRANSVAGINAL ULTRASOUND OF PELVIS  TECHNIQUE: Both transabdominal and transvaginal ultrasound examinations of the pelvis were performed. Transabdominal technique was performed for global imaging of the pelvis including uterus, ovaries, adnexal regions, and pelvic cul-de-sac. It was necessary to proceed with endovaginal exam following the transabdominal exam to visualize the uterus and ovaries.  COMPARISON:  CT abdomen and pelvis 09/20/2012  FINDINGS: Uterus  Measurements: 7.4 x 3.8 x 4.9 cm, retroverted. No fibroids or other mass visualized.  Endometrium  Thickness: 4 mm.  Intrauterine device is present.  Right ovary  Measurements: 4 x 2 x 2.9 cm. Normal appearance/no adnexal mass.  Left ovary  Measurements: 3 x 1.4 x 2 cm. Normal appearance/no adnexal mass.  Other findings  Small lung free fluid in the pelvis.  IMPRESSION: Intrauterine device is in place. Uterus and ovaries are otherwise normal. Small amount of free fluid in the pelvis is probably physiologic.   Electronically Signed   By: Burman Nieves M.D.   On: 07/14/2014 02:55    Review of Systems  Constitutional: Negative for fever and chills.  Gastrointestinal: Negative for diarrhea  and constipation.  Genitourinary: Positive for dysuria. Negative for urgency, frequency, hematuria and flank pain.   Physical Exam   Blood pressure 117/61, pulse 68, temperature 98.7 F (37.1 C), resp. rate 18, height  (1.626 m), weight 56.79 kg (125 lb 3.2 oz), last menstrual period 06/23/2013.  Physical Exam  Constitutional: She is oriented to person, place, and time. She appears well-developed and well-nourished.  HENT:  Head: Normocephalic.  Eyes: Pupils are equal, round, and reactive to light.  Neck: Neck supple.  Respiratory: Effort normal.  GI: Soft. She exhibits no distension. There is tenderness in the suprapubic area. There is no rebound.  Genitourinary:  Speculum exam: Vagina - Small amount of creamy, brown discharge, no odor Cervix -  No contact bleeding, IUD strings visualized  Bimanual exam: Cervix closed Uterus non tender, normal size Adnexa non tender, no masses bilaterally, + suprapubic tenderness  Chaperone present for exam.   Musculoskeletal: Normal range of motion.  Neurological: She is alert and oriented to person, place, and time.  Skin: Skin is warm.  Psychiatric: Her behavior is normal.    MAU Course  Procedures  None  MDM UA Urine culture pending  Spoke to Dr. Richardson Dopp at 0200 Pyridium given 200 mg in MAU  At the time of discharge the patient states that the pain is unbearable > will proceed with [place]  Toradol 60 mg IM  Spoke to Dr. Mayford Knife with Radiology and IUD is in a normal position.   Patient states her pain has eased a lot at the time of discharge, following the [place]  Assessment and Plan   A:  1. UTI (lower urinary tract infection)   2. Pelvic pain in female     P;  Discharge home in stable condition RX: bactrim, pyridium and diflucan  Follow up with PCP as needed, if symptoms worsen Return to MAU for emergencies  Urine culture pending    Ashley Hansen Franshesca Chipman, NP 07/14/2014 4:11 AM

## 2014-07-14 NOTE — Progress Notes (Signed)
Jennifer Rasch NP in earlier to discuss test results and d/c plan. Written and verbal d/c instructions given and understanding voiced. 

## 2014-07-14 NOTE — MAU Note (Signed)
About 2030 started with dull aching down low. Occ have sharp pain that comes and goes. Called doc and told to take Ibuprofen 800mg  and if still hurting in an hour to be seen. Med eased it alittle but still hurting. Had Nat MathMirana put in 3wks ago and I'm still bleeding alitte

## 2014-07-16 LAB — URINE CULTURE
Colony Count: >=100000
Special Requests: NORMAL

## 2015-07-27 ENCOUNTER — Encounter (HOSPITAL_COMMUNITY): Payer: Self-pay | Admitting: Emergency Medicine

## 2015-07-27 ENCOUNTER — Emergency Department (HOSPITAL_COMMUNITY)
Admission: EM | Admit: 2015-07-27 | Discharge: 2015-07-28 | Disposition: A | Discharge status: Home / Self Care | Payer: BC Managed Care – PPO | Attending: Emergency Medicine | Admitting: Emergency Medicine

## 2015-07-27 DIAGNOSIS — R634 Abnormal weight loss: Secondary | ICD-10-CM | POA: Diagnosis not present

## 2015-07-27 DIAGNOSIS — J45909 Unspecified asthma, uncomplicated: Secondary | ICD-10-CM | POA: Diagnosis not present

## 2015-07-27 DIAGNOSIS — R739 Hyperglycemia, unspecified: Secondary | ICD-10-CM | POA: Diagnosis present

## 2015-07-27 DIAGNOSIS — E119 Type 2 diabetes mellitus without complications: Secondary | ICD-10-CM | POA: Insufficient documentation

## 2015-07-27 DIAGNOSIS — Z79818 Long term (current) use of other agents affecting estrogen receptors and estrogen levels: Secondary | ICD-10-CM | POA: Insufficient documentation

## 2015-07-27 DIAGNOSIS — Z8659 Personal history of other mental and behavioral disorders: Secondary | ICD-10-CM | POA: Insufficient documentation

## 2015-07-27 DIAGNOSIS — Z3202 Encounter for pregnancy test, result negative: Secondary | ICD-10-CM | POA: Diagnosis not present

## 2015-07-27 DIAGNOSIS — Z87440 Personal history of urinary (tract) infections: Secondary | ICD-10-CM | POA: Diagnosis not present

## 2015-07-27 LAB — CBC
HCT: 41.7 % (ref 36.0–46.0)
HEMOGLOBIN: 15 g/dL (ref 12.0–15.0)
MCH: 30.5 pg (ref 26.0–34.0)
MCHC: 36 g/dL (ref 30.0–36.0)
MCV: 84.9 fL (ref 78.0–100.0)
Platelets: 263 10*3/uL (ref 150–400)
RBC: 4.91 MIL/uL (ref 3.87–5.11)
RDW: 11.9 % (ref 11.5–15.5)
WBC: 7.3 10*3/uL (ref 4.0–10.5)

## 2015-07-27 LAB — CBG MONITORING, ED: Glucose-Capillary: 407 mg/dL — ABNORMAL HIGH (ref 65–99)

## 2015-07-27 NOTE — ED Notes (Signed)
Patient presents for cbg 500, no history of DM. C/o blurred vision, increased UO, increased thirst x1 week.

## 2015-07-28 ENCOUNTER — Encounter (HOSPITAL_COMMUNITY): Payer: Self-pay

## 2015-07-28 LAB — URINALYSIS, ROUTINE W REFLEX MICROSCOPIC
BILIRUBIN URINE: NEGATIVE
Leukocytes, UA: NEGATIVE
Nitrite: NEGATIVE
Protein, ur: NEGATIVE mg/dL
SPECIFIC GRAVITY, URINE: 1.043 — AB (ref 1.005–1.030)
pH: 5.5 (ref 5.0–8.0)

## 2015-07-28 LAB — BASIC METABOLIC PANEL
Anion gap: 13 (ref 5–15)
BUN: 14 mg/dL (ref 6–20)
CALCIUM: 9.7 mg/dL (ref 8.9–10.3)
CO2: 23 mmol/L (ref 22–32)
Chloride: 98 mmol/L — ABNORMAL LOW (ref 101–111)
Creatinine, Ser: 1.09 mg/dL — ABNORMAL HIGH (ref 0.44–1.00)
GFR calc Af Amer: >60 mL/min (ref >60)
GFR calc non Af Amer: >60 mL/min (ref >60)
Glucose, Bld: 422 mg/dL — ABNORMAL HIGH (ref 65–99)
POTASSIUM: 3.4 mmol/L — AB (ref 3.5–5.1)
SODIUM: 134 mmol/L — AB (ref 135–145)

## 2015-07-28 LAB — CBG MONITORING, ED
GLUCOSE-CAPILLARY: 143 mg/dL — AB (ref 65–99)
Glucose-Capillary: 306 mg/dL — ABNORMAL HIGH (ref 65–99)

## 2015-07-28 LAB — URINE MICROSCOPIC-ADD ON
Bacteria, UA: NONE SEEN
Squamous Epithelial / LPF: NONE SEEN
WBC, UA: NONE SEEN WBC/hpf (ref 0–5)

## 2015-07-28 LAB — POC URINE PREG, ED: Preg Test, Ur: NEGATIVE

## 2015-07-28 MED ORDER — SODIUM CHLORIDE 0.9 % IV BOLUS (SEPSIS)
1000.0000 mL | Freq: Once | INTRAVENOUS | Status: AC
  Administered 2015-07-28: 1000 mL via INTRAVENOUS

## 2015-07-28 MED ORDER — INSULIN ASPART 100 UNIT/ML ~~LOC~~ SOLN
8.0000 [IU] | Freq: Once | SUBCUTANEOUS | Status: AC
  Administered 2015-07-28: 8 [IU] via INTRAVENOUS
  Filled 2015-07-28: qty 1

## 2015-07-28 MED ORDER — METFORMIN HCL 500 MG PO TABS: 500.0000 mg | ORAL_TABLET | Freq: Two times a day (BID) | ORAL | Status: DC

## 2015-07-28 MED ORDER — ONDANSETRON HCL 4 MG/2ML IJ SOLN
4.0000 mg | Freq: Once | INTRAMUSCULAR | Status: AC | PRN
  Administered 2015-07-28: 4 mg via INTRAVENOUS
  Filled 2015-07-28: qty 2

## 2015-07-28 NOTE — ED Notes (Signed)
Pt reports increased nausea

## 2015-07-28 NOTE — ED Provider Notes (Signed)
CSN: [id number]     Arrival date & time 07/27/15  2318 History  By signing my name below, I, Ashley Hester, attest that this documentation has been prepared under the direction and in the presence of Ashley Albe, MD . Electronically Signed: Marisue Hester, Scribe. 07/28/2015. 3:15 AM.    Chief Complaint  Patient presents with  . Hyperglycemia   The history is provided by the patient. No language interpreter was used.   HPI Comments:  Ashley Hester is a 25 y.o. female with no PMHx of DM who presents to the Emergency Department complaining of hyperglycemia, cbg ~500 earlier today. Pt reports associated increased thirst for two weeks and vision changes onset three weeks ago, worsening in past two days. She also reports increased urination and has to get up once/night to urinate. No alleviating factors noted. Pt notes minor weight loss and recent BCP change. She reports episodes of vomiting for 5 days ago lasting 2 days. She notes a possible viral infection in late 2016. Pt has a significant FHX of type 1 and 2 DM in "everyone" in mothers family. Pt denies current nausea, vomiting, or abdominal pain. Pt denies any other symptoms or medical problems at this time. Pt denies smoking or alcohol use.   PCP Dr Massie Maroon  Past Medical History  Diagnosis Date  . ADHD (attention deficit hyperactivity disorder)   . Asthma     sport induced  . PONV (postoperative nausea and vomiting)     Hard to wake up  . UTI (lower urinary tract infection)    Past Surgical History  Procedure Laterality Date  . [place] teeth extraction    . Laparoscopy  10/13/2011    Procedure: LAPAROSCOPY DIAGNOSTIC;  Surgeon: Emelia Loron, MD;  Location: [facility];  Service: General;  Laterality: N/A;  . Laparoscopic appendectomy  10/13/2011    Procedure: APPENDECTOMY LAPAROSCOPIC;  Surgeon: Emelia Loron, MD;  Location: [facility];  Service: General;  Laterality: N/A;  . Appendectomy  [id number]   Family History  Problem  Relation Age of Onset  . Crohn's disease Maternal Grandmother   . Cancer Maternal Grandmother   . Hypertension Mother   . Cancer Paternal Uncle     brain/lung  . Diabetes Paternal Uncle    Social History  Substance Use Topics  . Smoking status: Never Smoker   . Smokeless tobacco: Never Used  . Alcohol Use: No   Newlywed in September Employed as special education teacher  OB History    Gravida Para Term Preterm AB TAB SAB Ectopic Multiple Living   0 0 0 0 0 0 0 0 0 0      Review of Systems  Constitutional: Positive for unexpected weight change.  Eyes: Positive for visual disturbance.  Gastrointestinal: Negative for nausea, vomiting and abdominal pain.  Endocrine: Positive for polydipsia.  Genitourinary: Positive for frequency.  All other systems reviewed and are negative.  Allergies  Review of patient's allergies indicates no known allergies.  Home Medications   Prior to Admission medications   Medication Sig Start Date End Date Taking? Authorizing Provider  norethindrone-ethinyl estradiol (CYCLAFEM,ALYACEN) 0.5/0.75/1-35 MG-MCG tablet Take 1 tablet by mouth daily.   Yes Historical Provider, MD  fluconazole (DIFLUCAN) 150 MG tablet Take 1 tablet (150 mg total) by mouth daily. Patient not taking: Reported on 07/28/2015 07/14/14   Duane Lope, NP  ibuprofen (ADVIL,MOTRIN) 800 MG tablet Take 1 tablet (800 mg total) by mouth 3 (three) times daily. Patient not taking: Reported on 07/28/2015  09/18/12   Elson Areas, PA-C  metFORMIN (GLUCOPHAGE) 500 MG tablet Take 1 tablet (500 mg total) by mouth 2 (two) times daily with a meal. 07/28/15   Ashley Albe, MD  phenazopyridine (PYRIDIUM) 200 MG tablet Take 1 tablet (200 mg total) by mouth 3 (three) times daily. Patient not taking: Reported on 07/28/2015 07/14/14   Duane Lope, NP   BP 121/71 mmHg  Pulse 91  Temp(Src) 98.1 F (36.7 C) (Oral)  Resp 16  SpO2 97%  LMP 06/26/2015  Vital signs normal   Physical Exam   Constitutional: She is oriented to person, place, and time. She appears well-developed and well-nourished.  Non-toxic appearance. She does not appear ill. No distress.  HENT:  Head: Normocephalic and atraumatic.  Right Ear: External ear normal.  Left Ear: External ear normal.  Nose: Nose normal. No mucosal edema or rhinorrhea.  Mouth/Throat: Oropharynx is clear and moist and mucous membranes are normal. No dental abscesses or uvula swelling.  Eyes: Conjunctivae and EOM are normal. Pupils are equal, round, and reactive to light.  Neck: Normal range of motion and full passive range of motion without pain. Neck supple.  Cardiovascular: Normal rate, regular rhythm and normal heart sounds.  Exam reveals no gallop and no friction rub.   No murmur heard. Pulmonary/Chest: Effort normal and breath sounds normal. No respiratory distress. She has no wheezes. She has no rhonchi. She has no rales. She exhibits no tenderness and no crepitus.  Abdominal: Soft. Normal appearance and bowel sounds are normal. She exhibits no distension. There is no tenderness. There is no rebound and no guarding.  Musculoskeletal: Normal range of motion. She exhibits no edema or tenderness.  Moves all extremities well.   Neurological: She is alert and oriented to person, place, and time. She has normal strength. No cranial nerve deficit.  Skin: Skin is warm, dry and intact. No rash noted. No erythema. No pallor.  Psychiatric: She has a normal mood and affect. Her speech is normal and behavior is normal. Her mood appears not anxious.  Nursing note and vitals reviewed.  ED Course  Procedures   Medications  sodium chloride 0.9 % bolus 1,000 mL (0 mLs Intravenous Stopped 07/28/15 0338)  sodium chloride 0.9 % bolus 1,000 mL (0 mLs Intravenous Stopped 07/28/15 0339)  insulin aspart (novoLOG) injection 8 Units (8 Units Intravenous Given 07/28/15 0254)  ondansetron (ZOFRAN) injection 4 mg (4 mg Intravenous Given 07/28/15 0258)     DIAGNOSTIC STUDIES:  Oxygen Saturation is 96% on RA, normal by my interpretation.    COORDINATION OF CARE:  1:59 AM Discussed lab results. Will administer fluids and insulin prior to discharge. Discussed treatment plan with pt at bedside and pt agreed to plan.  Patient was given IV fluids and given IV insulin. Her recheck CBG improved to 143. She was given diabetic diet instructions. She was started on metformin. She is to follow-up with her primary care doctor later this week. I advised her to keep log of her blood sugars to show her doctor. She does admit to eating lots of carbohydrates and sweets foods and she seems willing to change her diet.   Labs Review Results for orders placed or performed during the hospital encounter of 07/27/15  Basic metabolic panel  Result Value Ref Range   Sodium 134 (L) 135 - 145 mmol/L   Potassium 3.4 (L) 3.5 - 5.1 mmol/L   Chloride 98 (L) 101 - 111 mmol/L   CO2 23 22 - 32  mmol/L   Glucose, Bld 422 (H) 65 - 99 mg/dL   BUN 14 6 - 20 mg/dL   Creatinine, Ser 4.09 (H) 0.44 - 1.00 mg/dL   Calcium 9.7 8.9 - 81.1 mg/dL   GFR calc non Af Amer >60 >60 mL/min   GFR calc Af Amer >60 >60 mL/min   Anion gap 13 5 - 15  CBC  Result Value Ref Range   WBC 7.3 4.0 - 10.5 K/uL   RBC 4.91 3.87 - 5.11 MIL/uL   Hemoglobin 15.0 12.0 - 15.0 g/dL   HCT 91.4 78.2 - 95.6 %   MCV 84.9 78.0 - 100.0 fL   MCH 30.5 26.0 - 34.0 pg   MCHC 36.0 30.0 - 36.0 g/dL   RDW 21.3 08.6 - 57.8 %   Platelets 263 150 - 400 K/uL  Urinalysis, Routine w reflex microscopic (not at [facility])  Result Value Ref Range   Color, Urine YELLOW YELLOW   APPearance CLEAR CLEAR   Specific Gravity, Urine 1.043 (H) 1.005 - 1.030   pH 5.5 5.0 - 8.0   Glucose, UA >1000 (A) NEGATIVE mg/dL   Hgb urine dipstick TRACE (A) NEGATIVE   Bilirubin Urine NEGATIVE NEGATIVE   Ketones, ur >80 (A) NEGATIVE mg/dL   Protein, ur NEGATIVE NEGATIVE mg/dL   Nitrite NEGATIVE NEGATIVE   Leukocytes, UA NEGATIVE NEGATIVE   Urine microscopic-add on  Result Value Ref Range   Squamous Epithelial / LPF NONE SEEN NONE SEEN   WBC, UA NONE SEEN 0 - 5 WBC/hpf   RBC / HPF 0-5 0 - 5 RBC/hpf   Bacteria, UA NONE SEEN NONE SEEN  CBG monitoring, ED  Result Value Ref Range   Glucose-Capillary 407 (H) 65 - 99 mg/dL  CBG monitoring, ED  Result Value Ref Range   Glucose-Capillary 306 (H) 65 - 99 mg/dL   Comment 1 Notify RN    Comment 2 Document in Chart   POC Urine Pregnancy, ED (do NOT order at [facility])  Result Value Ref Range   Preg Test, Ur NEGATIVE NEGATIVE  CBG monitoring, ED  Result Value Ref Range   Glucose-Capillary 143 (H) 65 - 99 mg/dL   Comment 1 Notify RN    Comment 2 Document in Chart    Laboratory interpretation all normal except hyperglycemia without metabolic acidosis, glucosuria with ketones      MDM   Final diagnoses:  New onset type 2 diabetes mellitus (HCC)   New Prescriptions   METFORMIN (GLUCOPHAGE) 500 MG TABLET    Take 1 tablet (500 mg total) by mouth 2 (two) times daily with a meal.    Plan discharge  Ashley Albe, MD, FACEP  I personally performed the services described in this documentation, which was scribed in my presence. The recorded information has been reviewed and considered.  Ashley Albe, MD, Concha Pyo, MD 07/28/15 [id number]

## 2015-07-28 NOTE — Discharge Instructions (Signed)
Look at the diabetic diet instructions. Try to follow it. Monitor your blood sugar daily, when you first get up in the morning before you eat or drink and at bedtime. Right your blood sugar results down see you can show you're Dr. Toniann Fail follow-up in the office. Your goal is to have your blood sugar 80-100 in the morning. Take the metformin twice a day to begin, you may need to decrease it or increase it depending on how your blood sugars are doing.    Blood Glucose Monitoring, Adult Monitoring your blood glucose (also know as blood sugar) helps you to manage your diabetes. It also helps you and your health care provider monitor your diabetes and determine how well your treatment plan is working. WHY SHOULD YOU MONITOR YOUR BLOOD GLUCOSE?  It can help you understand how food, exercise, and medicine affect your blood glucose.  It allows you to know what your blood glucose is at any given moment. You can quickly tell if you are having low blood glucose (hypoglycemia) or high blood glucose (hyperglycemia).  It can help you and your health care provider know how to adjust your medicines.  It can help you understand how to manage an illness or adjust medicine for exercise. WHEN SHOULD YOU TEST? Your health care provider will help you decide how often you should check your blood glucose. This may depend on the type of diabetes you have, your diabetes control, or the types of medicines you are taking. Be sure to write down all of your blood glucose readings so that this information can be reviewed with your health care provider. See below for examples of testing times that your health care provider may suggest. Type 1 Diabetes  Test at least 2 times per day if your diabetes is well controlled, if you are using an insulin pump, or if you perform multiple daily injections.  If your diabetes is not well controlled or if you are sick, you may need to test more often.  It is a good idea to also  test:  Before every insulin injection.  Before and after exercise.  Between meals and 2 hours after a meal.  Occasionally between 2:00 a.m. and 3:00 a.m. Type 2 Diabetes  If you are taking insulin, test at least 2 times per day. However, it is best to test before every insulin injection.  If you take medicines by mouth (orally), test 2 times a day.  If you are on a controlled diet, test once a day.  If your diabetes is not well controlled or if you are sick, you may need to monitor more often. HOW TO MONITOR YOUR BLOOD GLUCOSE Supplies Needed  Blood glucose meter.  Test strips for your meter. Each meter has its own strips. You must use the strips that go with your own meter.  A pricking needle (lancet).  A device that holds the lancet (lancing device).  A journal or log book to write down your results. Procedure  Wash your hands with soap and water. Alcohol is not preferred.  Prick the side of your finger (not the tip) with the lancet.  Gently milk the finger until a small drop of blood appears.  Follow the instructions that come with your meter for inserting the test strip, applying blood to the strip, and using your blood glucose meter. Other Areas to Get Blood for Testing Some meters allow you to use other areas of your body (other than your finger) to test your blood.  These areas are called alternative sites. The most common alternative sites are:  The forearm.  The thigh.  The back area of the lower leg.  The palm of the hand. The blood flow in these areas is slower. Therefore, the blood glucose values you get may be delayed, and the numbers are different from what you would get from your fingers. Do not use alternative sites if you think you are having hypoglycemia. Your reading will not be accurate. Always use a finger if you are having hypoglycemia. Also, if you cannot feel your lows (hypoglycemia unawareness), always use your fingers for your blood glucose  checks. ADDITIONAL TIPS FOR GLUCOSE MONITORING  Do not reuse lancets.  Always carry your supplies with you.  All blood glucose meters have a 24-hour "hotline" number to call if you have questions or need help.  Adjust (calibrate) your blood glucose meter with a control solution after finishing a few boxes of strips. BLOOD GLUCOSE RECORD KEEPING It is a good idea to keep a daily record or log of your blood glucose readings. Most glucose meters, if not all, keep your glucose records stored in the meter. Some meters come with the ability to download your records to your home computer. Keeping a record of your blood glucose readings is especially helpful if you are wanting to look for patterns. Make notes to go along with the blood glucose readings because you might forget what happened at that exact time. Keeping good records helps you and your health care provider to work together to achieve good diabetes management.    This information is not intended to replace advice given to you by your health care provider. Make sure you discuss any questions you have with your health care provider.   Document Released: 06/04/2003 Document Revised: 06/22/2014 Document Reviewed: 10/24/2012 Elsevier Interactive Patient Education 2016 [employer].  Diabetes and Exercise Diabetes mellitus is a common, chronic disease in which a person's blood sugar (glucose) levels are often too high. Getting exercise on a regular basis is an important part of diabetes treatment. WHY SHOULD I EXERCISE REGULARLY IF I HAVE DIABETES? Exercising regularly provides many benefits for people who have diabetes. Some of these benefits include:  Lowering your blood glucose level.  Maintaining a healthy body weight and structure.  Reducing your risk of heart disease by:  Lowering your LDL cholesterol levels. LDL is sometimes called bad cholesterol.  Lowering your triglyceride levels.  Raising your HDL cholesterol levels. HDL  is sometimes called good cholesterol.  Decreasing your blood pressure.  Lowering your hemoglobin A1C levels. A1C is a blood test that determines your average blood glucose level over a 60-month period. Doing resistance exercises on a regular basis can help to lower these levels.  Improving your body's ability to use insulin and glucose. WHAT TYPES OF EXERCISE ARE RECOMMENDED FOR PEOPLE WHO HAVE DIABETES? A combination of resistance exercises and aerobic exercise is recommended for people who have diabetes. Resistance exercises are those that you do with free weights or weight machines. Aerobic exercise includes any exercise that raises your heart rate and breathing rate for a sustained amount of time. This can include activities such as:  Brisk walking.  Water or dry-land aerobics.  Bicycling or riding a stationary bike.  Jogging.  Dancing.  Hiking.  Moderate to vigorous gardening. Ask your health care provider what types of exercise are safe for you. HOW LONG AND HOW OFTEN SHOULD I EXERCISE? The American Diabetes Association and the [place] Department  of Health recommend the following:  Children who have diabetes or are at risk of developing diabetes (have prediabetes) should get 1 hour or more of exercise every day.  Adults who have diabetes should exercise with moderate intensity for 150 minutes or more per week. Moderate-intensity exercise is any exercise that raises your heart rate to 50-70% of your maximum heart rate. Ask your health care provider how to calculate this.  Adults who have diabetes should spread exercise over at least 3 days per week, but they should not go more than 2 days in a row without exercising.  Adults who have diabetes should do resistance exercise at least 2 days per week.  Adults who have diabetes should avoid sitting for more than 90 minutes at a time. WHAT SHOULD I DO BEFORE I START AN EXERCISE PROGRAM?  Talk with your health care provider before you  start a new exercise program. If you have, or are at risk for, certain medical conditions, you may need to have a physical exam and testing. Testing may include:  A chest X-ray.  An electrocardiogram (ECG). This test checks the electrical functioning of your heart.  Blood tests.  If you have type 1 diabetes, talk with your health care provider about managing your blood glucose levels with insulin before, during, and after exercise.  Understand that exercise affects blood glucose levels differently depending on how long you exercise and depending on other factors such as whether you are sick. Be ready to treat high blood glucose (hyperglycemia) and low blood glucose (hypoglycemia) right away if either occurs during or after exercise.  Talk with your health care provider about diabetes-related problems that can occur during exercise. Your health care provider can help you to decide on an exercise plan that allows you to avoid these problems. This plan might include instructions about choosing the proper footwear for exercise and staying well hydrated during and after exercise. HOW CAN I MANAGE MY BLOOD GLUCOSE LEVEL WHILE I EXERCISE?  Eat 1-3 hours before you exercise.  Check your blood glucose level immediately before and after exercise.  Do not start exercising if:  Your blood glucose is more than 250 mg/dL andyou do not feel well.  You have ketones in your urine.  Your blood glucose is less than 100 mg/dL.  If you do not feel well while exercising, take a break and check your blood glucose.  Stop exercising if you find that your blood glucose has dropped below 100 mg/dL. If this occurs, do the following:  Eat a 15-gram to 20-gram carbohydrate-rich snack.  Recheck your blood glucose level.  If your blood glucose level does not rise above 100 mg/dL, have another 16-XWRU to 20-gram carbohydrate snack.  Stop exercising if youfindthat your blood glucose has risen above 250 mg/dL  while exercising.  Do not go on with your workout until:  Your blood glucose level rises above 100 mg/dL if it was below this level.  Your blood glucose level drops below 250 mg/dL if it was above this level.  If you take insulin, you may need to alter your insulin schedule on the days that you exercise. This depends on how your blood glucose responds to exercise. Ask your health care provider how to do this.  Drink fluids during and after exercise to avoid dehydration. For exercise that lasts a long time, use a sports drink to maintain your glucose level. SEEK MEDICAL CARE IF:  You have stopped exercising because of hypoglycemia and your blood  glucose does not rise above 100 mg/dL after you have two 57-QION to 20-gram carbohydrate-rich snacks.  You notice a loss of sensation in your feet during or after exercise.  You have increased numbness, tingling, or pins-and-needles sensations after exercise.  You have a fast, irregular heartbeat (palpitations) during or after exercise.  Your exercise tolerance gets worse.  You have dizzy spells or feel faint for brief periods during or after exercise. SEEK IMMEDIATE MEDICAL CARE IF:  You have chest pain during or after exercise.  You pass out (lose consciousness) during or after exercise.  You have the following in addition to hyperglycemia:  Fatigue.  Dry or flushed skin.  Difficulty breathing.  Confusion.  Nausea, vomiting, or pain in the abdomen.  Fruity-smelling breath.   This information is not intended to replace advice given to you by your health care provider. Make sure you discuss any questions you have with your health care provider.   Document Released: 06/01/2005 Document Revised: 02/20/2015 Document Reviewed: 07/31/2014 Elsevier Interactive Patient Education [employer]

## 2015-07-28 NOTE — ED Notes (Signed)
Pt states nausea has resolved

## 2015-08-22 ENCOUNTER — Encounter: Payer: BC Managed Care – PPO | Attending: Internal Medicine | Admitting: *Deleted

## 2015-08-22 DIAGNOSIS — E1065 Type 1 diabetes mellitus with hyperglycemia: Secondary | ICD-10-CM | POA: Diagnosis present

## 2015-08-22 DIAGNOSIS — R358 Other polyuria: Secondary | ICD-10-CM | POA: Diagnosis not present

## 2015-08-22 DIAGNOSIS — R351 Nocturia: Secondary | ICD-10-CM | POA: Insufficient documentation

## 2015-08-22 DIAGNOSIS — R109 Unspecified abdominal pain: Secondary | ICD-10-CM | POA: Diagnosis not present

## 2015-08-22 DIAGNOSIS — R631 Polydipsia: Secondary | ICD-10-CM | POA: Diagnosis not present

## 2015-08-22 DIAGNOSIS — E108 Type 1 diabetes mellitus with unspecified complications: Secondary | ICD-10-CM

## 2015-08-22 NOTE — Patient Instructions (Signed)
Plan:  Continue to give 0.5 units pre every 8 grams Carb and correct per Dr. Daune PerchKerr's orders Each Carb choice = 15 grams carbohydrate Include protein in moderation with your meals and snacks Consider reading food labels for Total Carbohydrate of foods Continue with your activity level daily as tolerated Continue checking BG as directed by MD  Continue taking medication as directed by MD We have discussed pump and CGM therapy today. You have decided to start with Dexcom CGM. You have [employer]'s contact information to start this process Call this office to set up training once you know the Dexcom has been shipped

## 2015-08-25 ENCOUNTER — Encounter: Payer: Self-pay | Admitting: *Deleted

## 2015-08-25 NOTE — Progress Notes (Signed)
Diabetes Self-Management Education  Visit Type: First/Initial  Appt. Start Time: 0900 Appt. End Time: 1030  08/25/2015  [name], identified by name and date of birth, is a 25 y.o. female with a diagnosis of Diabetes: Type 1.   ASSESSMENT  Last menstrual period 06/26/2015. There is no weight on file to calculate BMI.      Diabetes Self-Management Education - 08/25/15 1210    Visit Information   Visit Type First/Initial   Initial Visit   Diabetes Type Type 1   Are you currently following a meal plan? No   Are you taking your medications as prescribed? Yes   Date Diagnosed 07/28/2015   Health Coping   How would you rate your overall health? Good   Psychosocial Assessment   Patient Belief/Attitude about Diabetes Afraid   Self-care barriers None   Self-management support Doctor's office;Family;CDE visits   Other persons present Patient;Spouse/SO  Harrold Donath   Special Needs None   Preferred Learning Style Hands on   How often do you need to have someone help you when you read instructions, pamphlets, or other written materials from your doctor or pharmacy? 1 - Never   What is the last grade level you completed in school? 4 year college   Complications   Last HgB A1C per patient/outside source 9.9 %   How often do you check your blood sugar? > 4 times/day   Have you had a dilated eye exam in the past 12 months? Yes   Have you had a dental exam in the past 12 months? No   Are you checking your feet? No   Dietary Intake   Breakfast English muffin, gogurt   Snack (morning) fresh fruit   Lunch salad with grilled chicken, cheese, yogurt   Snack (afternoon) fresh fruit   Dinner lean meat, starch, vegetables, occasionally bread   Snack (evening) whole milk   Beverage(s) water, milk   Exercise   Exercise Type Moderate (swimming / aerobic walking)   How many days per week to you exercise? 3   How many minutes per day do you exercise? 60   Total minutes per week of exercise  180   Patient Education   Previous Diabetes Education No   Disease state  Definition of diabetes, type 1 and 2, and the diagnosis of diabetes   Nutrition management  Role of diet in the treatment of diabetes and the relationship between the three main macronutrients and blood glucose level;Food label reading, portion sizes and measuring food.;Carbohydrate counting   Physical activity and exercise  Role of exercise on diabetes management, blood pressure control and cardiac health.   Medications Reviewed patients medication for diabetes, action, purpose, timing of dose and side effects.   Monitoring Identified appropriate SMBG and/or A1C goals.   Acute complications Taught treatment of hypoglycemia - the 15 rule.   Chronic complications Relationship between chronic complications and blood glucose control   Psychosocial adjustment Helped patient identify a support system for diabetes management  DM 1 Support Group   Individualized Goals (developed by patient)   Nutrition Follow meal plan discussed   Physical Activity Exercise 3-5 times per week   Medications take my medication as prescribed  Introduced pump therapy options   Monitoring  test blood glucose pre and post meals as discussed  Discussed CGM options   Reducing Risk treat hypoglycemia with 15 grams of carbs if blood glucose less than /dL   Outcomes   Expected Outcomes Demonstrated interest in learning.  Expect positive outcomes   Future DMSE PRN   Program Status Not Completed  patient prefers to start with Dexcom CGM and not start on pump yet.      Individualized Plan for Diabetes Self-Management Training:   Learning Objective:  Patient will have a greater understanding of diabetes self-management. Patient education plan is to attend individual and/or group sessions per assessed needs and concerns.   Plan:   Patient Instructions  Plan:  Continue to give 0.5 units pre every 8 grams Carb and correct per Dr. Daune PerchKerr's  orders Each Carb choice = 15 grams carbohydrate Include protein in moderation with your meals and snacks Consider reading food labels for Total Carbohydrate of foods Continue with your activity level daily as tolerated Continue checking BG as directed by MD  Continue taking medication as directed by MD We have discussed pump and CGM therapy today. You have decided to start with Dexcom CGM. You have [employer]acey Wood's contact information to start this process Call this office to set up training once you know the Dexcom has been shipped       Expected Outcomes:  Demonstrated interest in learning. Expect positive outcomes  Education material provided: Living Well with Diabetes, A1C conversion sheet, Meal plan card, Support group flyer and Carbohydrate counting sheet  If problems or questions, patient to contact team via:  Phone and Email  Future DSME appointment: PRN

## 2015-09-18 ENCOUNTER — Ambulatory Visit: Payer: BLUE CROSS/BLUE SHIELD | Admitting: *Deleted

## 2016-05-26 ENCOUNTER — Other Ambulatory Visit: Payer: Self-pay | Admitting: Obstetrics and Gynecology

## 2016-05-26 ENCOUNTER — Ambulatory Visit (HOSPITAL_COMMUNITY)
Admission: RE | Admit: 2016-05-26 | Discharge: 2016-05-26 | Disposition: A | Discharge status: Home / Self Care | Payer: BC Managed Care – PPO | Source: Ambulatory Visit | Attending: Obstetrics and Gynecology | Admitting: Obstetrics and Gynecology

## 2016-05-26 DIAGNOSIS — N8311 Corpus luteum cyst of right ovary: Secondary | ICD-10-CM | POA: Diagnosis not present

## 2016-05-26 DIAGNOSIS — R102 Pelvic and perineal pain: Secondary | ICD-10-CM | POA: Diagnosis not present

## 2016-05-26 DIAGNOSIS — Z3A01 Less than 8 weeks gestation of pregnancy: Secondary | ICD-10-CM | POA: Insufficient documentation

## 2016-05-26 DIAGNOSIS — O3481 Maternal care for other abnormalities of pelvic organs, first trimester: Secondary | ICD-10-CM | POA: Insufficient documentation

## 2016-05-26 DIAGNOSIS — O26891 Other specified pregnancy related conditions, first trimester: Secondary | ICD-10-CM | POA: Diagnosis present

## 2016-05-29 LAB — OB RESULTS CONSOLE RPR: RPR: NONREACTIVE

## 2016-05-29 LAB — OB RESULTS CONSOLE HIV ANTIBODY (ROUTINE TESTING): HIV: NONREACTIVE

## 2016-05-29 LAB — OB RESULTS CONSOLE RUBELLA ANTIBODY, IGM: Rubella: IMMUNE

## 2016-05-29 LAB — OB RESULTS CONSOLE HEPATITIS B SURFACE ANTIGEN: Hepatitis B Surface Ag: NEGATIVE

## 2016-06-05 LAB — OB RESULTS CONSOLE GC/CHLAMYDIA
Chlamydia: NEGATIVE
GC PROBE AMP, GENITAL: NEGATIVE

## 2016-06-15 NOTE — L&D Delivery Note (Signed)
 ------------------------------------------------------------------------------- Attestation signed by Eleanor LITTIE Everts, MD at 12/20/16 [phone] I was present and supervised the entire procedure.  Eleanor Everts, MD [facility]   -------------------------------------------------------------------------------  [facility]  Labor & Delivery Summary   Maternal History  Gravida/Para:  G1P0 EDD:   01/18/2017, by Other Basis No results found for: [facility] Problems   DKA (diabetic ketoacidoses) (*)    Labor Information   Cervical ripening: 12/18/2016       Misoprostol;Foley/EASI    Induction:     Indications for Induction:               Induction Date:     Induction Time:     Augmentation:     Augmentation Date:     Augmentation Time:     Antibiotics:                    Yes   ROM: Rupture date: 12/20/2016   Rupture time: 2:05 AM   ROM method:          AROM  Amniotic fluid:                               Blood Tinged  Odor: None  Complete Cervical Dilation:                       Labor EventTimes:                    First Stage:    hour(s),    minute(s) Second Stage:    hour(s),    minute(s) Third Stage:    hour(s),    minute(s)  Complications:      Anesthesia Type:        Combined Spinal Epidural   Delivery Information:  Delivery Clinician:                      Other Staff:    Delivery Assist;Delivery Nurse;Delivery Nurse-Infant;Resident    Episiotomy:      Degree:    Repaired:       Lacerations: Perineal:    Repaired:        Periurethral:        Repaired:      Labial:    Repaired:      Sulcus:    Repaired:      Vaginal:    Repaired:      Cervical:     Repaired:       C-Section Details: Primary/repeat:     Priority:     Indications:      Incision type:      Total estimated blood loss (mL): 200   Total quantitative blood loss (mL): (OB) QBL- Total Blood Loss : 749 mL   Baby Information:   Date  of Birth: 12/20/2015   Time of Birth: 3:38 AM    Method of Delivery:                   Born en Route:                           VBAC:  Details of Assisted Delivery (if applicable)  Forceps attempted?     Forceps indication:     Forceps type:     Application location:     Number of pulls:     Total application time:     Applied by:     Failed?        Vacuum attempted?      Vacuum indication:     Vacuum type:     Application location:     First Attempt    Time applied:     Time removed:     Second Attempt   Time applied:     Time removed:     Third Attempt   Time applied:     Time removed:     Number of pulls:     Number of pop-offs:     HIgh-end pressure range:     Total application time:     Applied by:     Failed?      Presentation/Position: Vertex ;      ;   ;     Details of Shoulder Dystocia (if applicable)  Dystocia Present?                          Time head delivered:                     Time body delivered:                   3:38 AM  Gentle attempt at traction, assisted by maternal expulsive forces:      If no, why:    Anterior shoulder:    Time recognized:     Time help called:    Called by:     Time provider arrived:    Provider who arrived:     Time NICU arrived:    NICU staff:     Time additional staff arrived:    Additional staff:      Maneuver                                              Time Performed                                Performed By  Delivery of posterior arm         Fetal clavicular fracture         Gaskin maneuver         McRoberts maneuver         Rubin maneuver         Suprapubic pressure         Symphysiotomy         Woods screw maneuver         Zavanelli maneuver         Comments:   Cord Information:       Disposition of cord blood:            Blood gases sent?    Complications:     Nuchal intervention:     Nuchal cord description:     Cord around:     Number of loops:     Stem  cells collected by MD?  No  Delayed cord clamp:                        Comments:     Placenta Delivered:           Appearance:      Removal:              Disposition:        Newborn Assessment: Gestational Age:    [redacted]w[redacted]d Living:     Sex:   female                APGARS One minute Five minutes Ten minutes  Skin color:              Heart rate:              Grimace:              Muscle Tone:              Breathing:              Totals:               Resuscitation:     Neo Staff:     Meconium:     Resuscitation Note:              Electronically Signed: Garnette KATHEE Hummer, MD 12/20/2016 / 6:05 AM

## 2016-10-01 ENCOUNTER — Inpatient Hospital Stay (HOSPITAL_COMMUNITY)
Admission: AD | Admit: 2016-10-01 | Discharge: 2016-10-01 | Disposition: A | Discharge status: Home / Self Care | Payer: BC Managed Care – PPO | Source: Ambulatory Visit | Attending: Obstetrics & Gynecology | Admitting: Obstetrics & Gynecology

## 2016-10-01 ENCOUNTER — Encounter (HOSPITAL_COMMUNITY): Payer: Self-pay | Admitting: *Deleted

## 2016-10-01 DIAGNOSIS — O99512 Diseases of the respiratory system complicating pregnancy, second trimester: Secondary | ICD-10-CM | POA: Diagnosis not present

## 2016-10-01 DIAGNOSIS — F909 Attention-deficit hyperactivity disorder, unspecified type: Secondary | ICD-10-CM | POA: Diagnosis not present

## 2016-10-01 DIAGNOSIS — O479 False labor, unspecified: Secondary | ICD-10-CM

## 2016-10-01 DIAGNOSIS — J45909 Unspecified asthma, uncomplicated: Secondary | ICD-10-CM | POA: Diagnosis not present

## 2016-10-01 DIAGNOSIS — Z3A24 24 weeks gestation of pregnancy: Secondary | ICD-10-CM

## 2016-10-01 DIAGNOSIS — O24912 Unspecified diabetes mellitus in pregnancy, second trimester: Secondary | ICD-10-CM | POA: Diagnosis not present

## 2016-10-01 DIAGNOSIS — O99342 Other mental disorders complicating pregnancy, second trimester: Secondary | ICD-10-CM | POA: Diagnosis not present

## 2016-10-01 DIAGNOSIS — O4702 False labor before 37 completed weeks of gestation, second trimester: Secondary | ICD-10-CM | POA: Insufficient documentation

## 2016-10-01 DIAGNOSIS — Z3689 Encounter for other specified antenatal screening: Secondary | ICD-10-CM

## 2016-10-01 DIAGNOSIS — Z794 Long term (current) use of insulin: Secondary | ICD-10-CM | POA: Insufficient documentation

## 2016-10-01 DIAGNOSIS — O99612 Diseases of the digestive system complicating pregnancy, second trimester: Secondary | ICD-10-CM | POA: Insufficient documentation

## 2016-10-01 LAB — URINALYSIS, ROUTINE W REFLEX MICROSCOPIC
Bilirubin Urine: NEGATIVE
Glucose, UA: 50 mg/dL — AB
Hgb urine dipstick: NEGATIVE
Ketones, ur: NEGATIVE mg/dL
LEUKOCYTES UA: NEGATIVE
Nitrite: NEGATIVE
PH: 6 (ref 5.0–8.0)
Protein, ur: NEGATIVE mg/dL
SPECIFIC GRAVITY, URINE: 1.006 (ref 1.005–1.030)

## 2016-10-01 LAB — WET PREP, GENITAL
CLUE CELLS WET PREP: NONE SEEN
SPERM: NONE SEEN
Trich, Wet Prep: NONE SEEN
YEAST WET PREP: NONE SEEN

## 2016-10-01 NOTE — MAU Note (Signed)
Pt reports she is having interment periods of her stomach tightening and relaxing last about an hour and then stops. Happen twice yesterday and again this morning around 7 am. Currently not feeling any pain or pressure. Good fetal movement reported throughout. Is a type 1 DM.

## 2016-10-01 NOTE — Discharge Instructions (Signed)

## 2016-10-01 NOTE — MAU Provider Note (Signed)
History     CSN: [id number]  Arrival date and time: 10/01/16 4098   First Provider Initiated Contact with Patient 10/01/16 [phone]      Chief Complaint  Patient presents with  . Contractions   G1 .2 wks here with ctx and pelvic pressure. Sx started yesterday. She describes as tightening and increased pelvic pressure for about 1 minute. She had 3 episodes of this yesterday, each lasting 30-60 min. Sx occurred again this am. She denies vaginal discharge, VB, and LOF. Reports good FM. No urinary sx. Occasional constipation but had BM yesterday. No recent IC. Reports good hydration, 2 bottles of water today and multiple yesterday. Pregnancy has been complicated by Type I DM, she is taking Insulin injections several times per day.   OB History    Gravida Para Term Preterm AB Living   1 0 0 0 0 0   SAB TAB Ectopic Multiple Live Births   0 0 0 0        Past Medical History:  Diagnosis Date  . ADHD (attention deficit hyperactivity disorder)   . Asthma    sport induced  . Diabetes mellitus without complication (HCC)   . PONV (postoperative nausea and vomiting)    Hard to wake up  . UTI (lower urinary tract infection)     Past Surgical History:  Procedure Laterality Date  . APPENDECTOMY  [id number]  . LAPAROSCOPIC APPENDECTOMY  10/13/2011   Procedure: APPENDECTOMY LAPAROSCOPIC;  Surgeon: Emelia Loron, MD;  Location: [facility];  Service: General;  Laterality: N/A;  . LAPAROSCOPY  10/13/2011   Procedure: LAPAROSCOPY DIAGNOSTIC;  Surgeon: Emelia Loron, MD;  Location: [facility];  Service: General;  Laterality: N/A;  . wisdon teeth extraction      Family History  Problem Relation Age of Onset  . Hypertension Mother   . Crohn's disease Maternal Grandmother   . Cancer Maternal Grandmother   . Cancer Paternal Uncle     brain/lung  . Diabetes Paternal Uncle     Social History  Substance Use Topics  . Smoking status: Never Smoker  . Smokeless tobacco: Never Used  . Alcohol use  No    Allergies: No Known Allergies  Prescriptions Prior to Admission  Medication Sig Dispense Refill Last Dose  . fluconazole (DIFLUCAN) 150 MG tablet Take 1 tablet (150 mg total) by mouth daily. (Patient not taking: Reported on 07/28/2015) 1 tablet 0   . ibuprofen (ADVIL,MOTRIN) 800 MG tablet Take 1 tablet (800 mg total) by mouth 3 (three) times daily. (Patient not taking: Reported on 07/28/2015) 21 tablet 0 07/13/2014 at 2230  . insulin aspart (NOVOLOG FLEXPEN) 100 UNIT/ML FlexPen Inject 4-8 Units into the skin 3 (three) times daily with meals.     . Insulin Degludec (TRESIBA FLEXTOUCH) 100 UNIT/ML SOPN Inject 14 Units into the skin daily.     . metFORMIN (GLUCOPHAGE) 500 MG tablet Take 1 tablet (500 mg total) by mouth 2 (two) times daily with a meal. (Patient not taking: Reported on 08/25/2015) 60 tablet 0 Not Taking  . norethindrone-ethinyl estradiol (CYCLAFEM,ALYACEN) 0.5/0.75/1-35 MG-MCG tablet Take 1 tablet by mouth daily.   Taking  . phenazopyridine (PYRIDIUM) 200 MG tablet Take 1 tablet (200 mg total) by mouth 3 (three) times daily. (Patient not taking: Reported on 07/28/2015) 6 tablet 0     Review of Systems  Constitutional: Negative for fever.  Gastrointestinal: Positive for abdominal pain.  Genitourinary: Negative for dysuria, frequency, hematuria, urgency, vaginal bleeding and vaginal discharge.   Physical  Exam   Blood pressure (!) 104/53, pulse 99, temperature 97.9 F (36.6 C), temperature source Oral, resp. rate 18, height  (1.626 m), weight 71.3 kg (157 lb 1.3 oz).  Physical Exam  Constitutional: She is oriented to person, place, and time. She appears well-developed and well-nourished. No distress.  HENT:  Head: Normocephalic and atraumatic.  Neck: Normal range of motion.  Respiratory: Effort normal.  GI: Soft. She exhibits no distension. There is no tenderness.  gravid  Genitourinary:  Genitourinary Comments: External: no lesions or erythema Vagina: rugated,  nulli, scant thin white discharge SVE: closed/thick   Musculoskeletal: Normal range of motion.  Neurological: She is alert and oriented to person, place, and time.  Skin: Skin is warm and dry.  Psychiatric: She has a normal mood and affect.   EFM: 135 bpm, mod variability, + accels, no decels Toco: none  Results for orders placed or performed during the hospital encounter of 10/01/16 (from the past 24 hour(s))  Urinalysis, Routine w reflex microscopic     Status: Abnormal   Collection Time: 10/01/16  9:31 AM  Result Value Ref Range   Color, Urine YELLOW YELLOW   APPearance CLEAR CLEAR   Specific Gravity, Urine 1.006 1.005 - 1.030   pH 6.0 5.0 - 8.0   Glucose, UA 50 (A) NEGATIVE mg/dL   Hgb urine dipstick NEGATIVE NEGATIVE   Bilirubin Urine NEGATIVE NEGATIVE   Ketones, ur NEGATIVE NEGATIVE mg/dL   Protein, ur NEGATIVE NEGATIVE mg/dL   Nitrite NEGATIVE NEGATIVE   Leukocytes, UA NEGATIVE NEGATIVE  Wet prep, genital     Status: Abnormal   Collection Time: 10/01/16 10:43 AM  Result Value Ref Range   Yeast Wet Prep HPF POC NONE SEEN NONE SEEN   Trich, Wet Prep NONE SEEN NONE SEEN   Clue Cells Wet Prep HPF POC NONE SEEN NONE SEEN   WBC, Wet Prep HPF POC MANY (A) NONE SEEN   Sperm NONE SEEN    MAU Course  Procedures  MDM Labs ordered and reviewed. Blood glucose 60 (per pt continuous monitor), juice given. No evidence of UTI, vaginal infection, or PTL. Presentation, clinical findings, and plan discussed with Dr. Charlotta Newton. Stable for discharge home.  Assessment and Plan   1. [redacted] weeks gestation of pregnancy   2. NST (non-stress test) reactive   3. Braxton Hicks contractions    Discharge home Follow up in office as scheduled Increase water intake OOW until tomorrow PTL precautions  Allergies as of 10/01/2016   No Known Allergies     Medication List    TAKE these medications   famotidine 10 MG tablet Commonly known as:  PEPCID Take 10 mg by mouth daily as needed for  heartburn or indigestion.   NOVOLOG FLEXPEN 100 UNIT/ML FlexPen Generic drug:  insulin aspart Inject 5-15 Units into the skin 3 (three) times daily with meals.   prenatal multivitamin Tabs tablet Take 1 tablet by mouth daily at 12 noon.   TRESIBA FLEXTOUCH 100 UNIT/ML Sopn FlexTouch Pen Generic drug:  insulin degludec Inject 14 Units into the skin daily.      Donette Larry, CNM 10/01/2016, 10:22 AM

## 2016-10-02 LAB — GC/CHLAMYDIA PROBE AMP (~~LOC~~) NOT AT ARMC
Chlamydia: NEGATIVE
Neisseria Gonorrhea: NEGATIVE

## 2016-12-08 ENCOUNTER — Inpatient Hospital Stay
Admission: AD | Admit: 2016-12-08 | Payer: Self-pay | Source: Other Acute Inpatient Hospital | Admitting: Emergency Medicine

## 2016-12-08 ENCOUNTER — Encounter (HOSPITAL_COMMUNITY): Payer: Self-pay

## 2016-12-08 ENCOUNTER — Inpatient Hospital Stay (HOSPITAL_COMMUNITY)
Admission: AD | Admit: 2016-12-08 | Discharge: 2016-12-09 | DRG: 781 | Disposition: A | Discharge status: Other Acute Inpatient Hospital | Payer: BC Managed Care – PPO | Source: Ambulatory Visit | Attending: Obstetrics and Gynecology | Admitting: Obstetrics and Gynecology

## 2016-12-08 ENCOUNTER — Inpatient Hospital Stay (HOSPITAL_COMMUNITY): Payer: BC Managed Care – PPO | Admitting: Certified Registered Nurse Anesthetist

## 2016-12-08 ENCOUNTER — Inpatient Hospital Stay (HOSPITAL_COMMUNITY): Payer: BC Managed Care – PPO

## 2016-12-08 DIAGNOSIS — O1493 Unspecified pre-eclampsia, third trimester: Secondary | ICD-10-CM | POA: Diagnosis present

## 2016-12-08 DIAGNOSIS — Z3A34 34 weeks gestation of pregnancy: Secondary | ICD-10-CM

## 2016-12-08 DIAGNOSIS — E781 Pure hyperglyceridemia: Secondary | ICD-10-CM | POA: Diagnosis present

## 2016-12-08 DIAGNOSIS — Z794 Long term (current) use of insulin: Secondary | ICD-10-CM | POA: Diagnosis not present

## 2016-12-08 DIAGNOSIS — E1010 Type 1 diabetes mellitus with ketoacidosis without coma: Secondary | ICD-10-CM | POA: Diagnosis present

## 2016-12-08 DIAGNOSIS — O24013 Pre-existing diabetes mellitus, type 1, in pregnancy, third trimester: Secondary | ICD-10-CM | POA: Diagnosis present

## 2016-12-08 DIAGNOSIS — J9600 Acute respiratory failure, unspecified whether with hypoxia or hypercapnia: Secondary | ICD-10-CM | POA: Diagnosis present

## 2016-12-08 DIAGNOSIS — Z9641 Presence of insulin pump (external) (internal): Secondary | ICD-10-CM | POA: Diagnosis present

## 2016-12-08 DIAGNOSIS — E872 Acidosis: Secondary | ICD-10-CM

## 2016-12-08 DIAGNOSIS — O99513 Diseases of the respiratory system complicating pregnancy, third trimester: Secondary | ICD-10-CM | POA: Diagnosis present

## 2016-12-08 DIAGNOSIS — R0602 Shortness of breath: Secondary | ICD-10-CM

## 2016-12-08 DIAGNOSIS — G8929 Other chronic pain: Secondary | ICD-10-CM

## 2016-12-08 HISTORY — DX: Dyspnea, unspecified: R06.00

## 2016-12-08 LAB — CBC
HCT: 39.9 % (ref 36.0–46.0)
HEMATOCRIT: 39.8 % (ref 36.0–46.0)
HEMOGLOBIN: 14 g/dL (ref 12.0–15.0)
HEMOGLOBIN: 14.1 g/dL (ref 12.0–15.0)
MCH: 32.7 pg (ref 26.0–34.0)
MCH: 32.8 pg (ref 26.0–34.0)
MCHC: 35.1 g/dL (ref 30.0–36.0)
MCHC: 35.4 g/dL (ref 30.0–36.0)
MCV: 93.2 fL (ref 78.0–100.0)
MCV: 92.6 fL (ref 78.0–100.0)
Platelets: 210 10*3/uL (ref 150–400)
Platelets: 207 10*3/uL (ref 150–400)
RBC: 4.28 MIL/uL (ref 3.87–5.11)
RBC: 4.3 MIL/uL (ref 3.87–5.11)
RDW: 15.2 % (ref 11.5–15.5)
RDW: 15 % (ref 11.5–15.5)
WBC: 10.3 10*3/uL (ref 4.0–10.5)
WBC: 13 10*3/uL — ABNORMAL HIGH (ref 4.0–10.5)

## 2016-12-08 LAB — SALICYLATE LEVEL: Salicylate Lvl: <7 mg/dL (ref 2.8–30.0)

## 2016-12-08 LAB — PHOSPHORUS: PHOSPHORUS: 3.4 mg/dL (ref 2.5–4.6)

## 2016-12-08 LAB — FETAL FIBRONECTIN: FETAL FIBRONECTIN: NEGATIVE

## 2016-12-08 LAB — GLUCOSE, CAPILLARY
GLUCOSE-CAPILLARY: 183 mg/dL — AB (ref 65–99)
GLUCOSE-CAPILLARY: 84 mg/dL (ref 65–99)
Glucose-Capillary: 153 mg/dL — ABNORMAL HIGH (ref 65–99)
Glucose-Capillary: 177 mg/dL — ABNORMAL HIGH (ref 65–99)
Glucose-Capillary: 168 mg/dL — ABNORMAL HIGH (ref 65–99)

## 2016-12-08 LAB — URINALYSIS, ROUTINE W REFLEX MICROSCOPIC
BILIRUBIN URINE: NEGATIVE
Glucose, UA: NEGATIVE mg/dL
HGB URINE DIPSTICK: NEGATIVE
Ketones, ur: 80 mg/dL — AB
LEUKOCYTES UA: NEGATIVE
Nitrite: NEGATIVE
Protein, ur: 30 mg/dL — AB
SPECIFIC GRAVITY, URINE: 1.009 (ref 1.005–1.030)
pH: 5 (ref 5.0–8.0)

## 2016-12-08 LAB — BASIC METABOLIC PANEL
BUN: <5 mg/dL — ABNORMAL LOW (ref 6–20)
BUN: <5 mg/dL — ABNORMAL LOW (ref 6–20)
CALCIUM: 9.9 mg/dL (ref 8.9–10.3)
CALCIUM: 8.5 mg/dL — AB (ref 8.9–10.3)
CHLORIDE: 112 mmol/L — AB (ref 101–111)
CO2: <7 mmol/L — ABNORMAL LOW (ref 22–32)
CREATININE: 1.16 mg/dL — AB (ref 0.44–1.00)
CREATININE: 1.1 mg/dL — AB (ref 0.44–1.00)
Chloride: 105 mmol/L (ref 101–111)
GFR calc Af Amer: >60 mL/min (ref >60)
GFR calc non Af Amer: >60 mL/min (ref >60)
GFR calc non Af Amer: >60 mL/min (ref >60)
GLUCOSE: 175 mg/dL — AB (ref 65–99)
Glucose, Bld: 99 mg/dL (ref 65–99)
Potassium: 4.1 mmol/L (ref 3.5–5.1)
Potassium: 4.4 mmol/L (ref 3.5–5.1)
SODIUM: 130 mmol/L — AB (ref 135–145)
Sodium: 133 mmol/L — ABNORMAL LOW (ref 135–145)

## 2016-12-08 LAB — BLOOD GAS, ARTERIAL
ACID-BASE DEFICIT: 23.3 mmol/L — AB (ref 0.0–2.0)
Acid-base deficit: 23.1 mmol/L — ABNORMAL HIGH (ref 0.0–2.0)
BICARBONATE: 4.1 mmol/L — AB (ref 20.0–28.0)
Bicarbonate: 4.1 mmol/L — ABNORMAL LOW (ref 20.0–28.0)
DRAWN BY: 312761
Drawn by: 12507
FIO2: 0.21
FIO2: 40
LHR: 20 {breaths}/min
MECHVT: 450 mL
O2 SAT: 100 %
O2 Saturation: 99.3 %
PCO2 ART: 10.6 mmHg — AB (ref 32.0–48.0)
PEEP: 5 cmH2O
PH ART: 7.187 — AB (ref 7.350–7.450)
PO2 ART: 138 mmHg — AB (ref 83.0–108.0)
pCO2 arterial: 11.3 mmHg — CL (ref 32.0–48.0)
pH, Arterial: 7.211 — ABNORMAL LOW (ref 7.350–7.450)
pO2, Arterial: 118 mmHg — ABNORMAL HIGH (ref 83.0–108.0)

## 2016-12-08 LAB — COMPREHENSIVE METABOLIC PANEL
ALBUMIN: 3.3 g/dL — AB (ref 3.5–5.0)
ALBUMIN: 3.3 g/dL — AB (ref 3.5–5.0)
ALT: 58 U/L — ABNORMAL HIGH (ref 14–54)
ALT: 56 U/L — ABNORMAL HIGH (ref 14–54)
ANION GAP: 18 — AB (ref 5–15)
AST: 45 U/L — ABNORMAL HIGH (ref 15–41)
AST: 42 U/L — ABNORMAL HIGH (ref 15–41)
Alkaline Phosphatase: 135 U/L — ABNORMAL HIGH (ref 38–126)
Alkaline Phosphatase: 131 U/L — ABNORMAL HIGH (ref 38–126)
BUN: 5 mg/dL — ABNORMAL LOW (ref 6–20)
BUN: <5 mg/dL — ABNORMAL LOW (ref 6–20)
CHLORIDE: 107 mmol/L (ref 101–111)
CHLORIDE: 107 mmol/L (ref 101–111)
CO2: 8 mmol/L — AB (ref 22–32)
Calcium: 10.3 mg/dL (ref 8.9–10.3)
Calcium: 10 mg/dL (ref 8.9–10.3)
Creatinine, Ser: 1.18 mg/dL — ABNORMAL HIGH (ref 0.44–1.00)
Creatinine, Ser: 1.1 mg/dL — ABNORMAL HIGH (ref 0.44–1.00)
GFR calc Af Amer: >60 mL/min (ref >60)
GFR calc Af Amer: >60 mL/min (ref >60)
GFR calc non Af Amer: >60 mL/min (ref >60)
GFR calc non Af Amer: >60 mL/min (ref >60)
GLUCOSE: 87 mg/dL (ref 65–99)
GLUCOSE: 99 mg/dL (ref 65–99)
POTASSIUM: 4.5 mmol/L (ref 3.5–5.1)
POTASSIUM: 4.3 mmol/L (ref 3.5–5.1)
SODIUM: 133 mmol/L — AB (ref 135–145)
SODIUM: 132 mmol/L — AB (ref 135–145)
Total Bilirubin: 1.2 mg/dL (ref 0.3–1.2)
Total Bilirubin: 0.9 mg/dL (ref 0.3–1.2)
Total Protein: 7 g/dL (ref 6.5–8.1)
Total Protein: 7.5 g/dL (ref 6.5–8.1)

## 2016-12-08 LAB — RAPID URINE DRUG SCREEN, HOSP PERFORMED
Amphetamines: NOT DETECTED
BARBITURATES: NOT DETECTED
Benzodiazepines: NOT DETECTED
COCAINE: NOT DETECTED
Opiates: NOT DETECTED
Tetrahydrocannabinol: NOT DETECTED

## 2016-12-08 LAB — URIC ACID: URIC ACID, SERUM: 9.2 mg/dL — AB (ref 2.3–6.6)

## 2016-12-08 LAB — ABO/RH: ABO/RH(D): B POS

## 2016-12-08 LAB — PROTEIN / CREATININE RATIO, URINE
Creatinine, Urine: 34 mg/dL
Protein Creatinine Ratio: 1.47 mg/mg{Cre} — ABNORMAL HIGH (ref 0.00–0.15)
Total Protein, Urine: 50 mg/dL

## 2016-12-08 LAB — LACTIC ACID, PLASMA: LACTIC ACID, VENOUS: 1.1 mmol/L (ref 0.5–1.9)

## 2016-12-08 LAB — TYPE AND SCREEN
ABO/RH(D): B POS
ANTIBODY SCREEN: NEGATIVE

## 2016-12-08 LAB — LACTATE DEHYDROGENASE: LDH: 131 U/L (ref 98–192)

## 2016-12-08 LAB — LIPASE, BLOOD: LIPASE: 32 U/L (ref 11–51)

## 2016-12-08 LAB — BETA-HYDROXYBUTYRIC ACID

## 2016-12-08 LAB — TRIGLYCERIDES: TRIGLYCERIDES: 724 mg/dL — AB (ref <150)

## 2016-12-08 LAB — MAGNESIUM: MAGNESIUM: 1.9 mg/dL (ref 1.7–2.4)

## 2016-12-08 LAB — ETHANOL

## 2016-12-08 MED ORDER — FENTANYL CITRATE (PF) 100 MCG/2ML IJ SOLN: 100.0000 ug | INTRAMUSCULAR | Status: DC | PRN

## 2016-12-08 MED ORDER — ONDANSETRON HCL 4 MG/2ML IJ SOLN: 4.0000 mg | Freq: Four times a day (QID) | INTRAMUSCULAR | Status: DC | PRN

## 2016-12-08 MED ORDER — DOCUSATE SODIUM 100 MG PO CAPS
100.0000 mg | ORAL_CAPSULE | Freq: Every day | ORAL | Status: DC
  Filled 2016-12-08: qty 1

## 2016-12-08 MED ORDER — PRENATAL MULTIVITAMIN CH: 1.0000 | ORAL_TABLET | Freq: Every day | ORAL | Status: DC

## 2016-12-08 MED ORDER — PHENYLEPHRINE 40 MCG/ML (10ML) SYRINGE FOR IV PUSH (FOR BLOOD PRESSURE SUPPORT)
PREFILLED_SYRINGE | INTRAVENOUS | Status: AC
  Administered 2016-12-08: 80 ug
  Filled 2016-12-08: qty 10

## 2016-12-08 MED ORDER — SODIUM CHLORIDE 0.45 % IV SOLN: INTRAVENOUS | Status: DC

## 2016-12-08 MED ORDER — DEXTROSE-NACL 5-0.45 % IV SOLN: INTRAVENOUS | Status: DC

## 2016-12-08 MED ORDER — MIDAZOLAM BOLUS VIA INFUSION
1.0000 mg | INTRAVENOUS | Status: DC | PRN
Start: 2016-12-08 — End: 2016-12-09
  Filled 2016-12-08: qty 2

## 2016-12-08 MED ORDER — LACTATED RINGERS IV BOLUS (SEPSIS)
1000.0000 mL | Freq: Once | INTRAVENOUS | Status: AC
  Administered 2016-12-08 (×2): 1000 mL via INTRAVENOUS

## 2016-12-08 MED ORDER — LABETALOL HCL 5 MG/ML IV SOLN: 20.0000 mg | INTRAVENOUS | Status: DC | PRN

## 2016-12-08 MED ORDER — MIDAZOLAM HCL 2 MG/2ML IJ SOLN
INTRAMUSCULAR | Status: AC
  Administered 2016-12-08: 2 mg
  Administered 2016-12-08: 2 mg via INTRAVENOUS
  Filled 2016-12-08: qty 2

## 2016-12-08 MED ORDER — BETAMETHASONE SOD PHOS & ACET 6 (3-3) MG/ML IJ SUSP: 12.0000 mg | Freq: Once | INTRAMUSCULAR | Status: DC

## 2016-12-08 MED ORDER — LACTATED RINGERS IV BOLUS (SEPSIS): 1000.0000 mL | Freq: Once | INTRAVENOUS | Status: DC

## 2016-12-08 MED ORDER — LACTATED RINGERS IV SOLN
INTRAVENOUS | Status: DC
  Administered 2016-12-08: 500 mL via INTRAVENOUS

## 2016-12-08 MED ORDER — ONDANSETRON HCL 4 MG/2ML IJ SOLN: 4.0000 mg | Freq: Once | INTRAMUSCULAR | Status: DC

## 2016-12-08 MED ORDER — MIDAZOLAM HCL 2 MG/2ML IJ SOLN
2.0000 mg | Freq: Once | INTRAMUSCULAR | Status: AC
  Administered 2016-12-08: 2 mg via INTRAVENOUS
  Administered 2016-12-08: 4 mg via INTRAVENOUS
  Administered 2016-12-08: 2 mg via INTRAVENOUS

## 2016-12-08 MED ORDER — SODIUM CHLORIDE 0.9 % IV SOLN
0.5000 mg/h | INTRAVENOUS | Status: DC
  Administered 2016-12-08: 2 mg/h via INTRAVENOUS
  Administered 2016-12-08: 0.5 mg/h via INTRAVENOUS
  Administered 2016-12-08: 6 mg/h via INTRAVENOUS
  Filled 2016-12-08: qty 10

## 2016-12-08 MED ORDER — DEXTROSE 50 % IV SOLN: 25.0000 mL | INTRAVENOUS | Status: DC | PRN

## 2016-12-08 MED ORDER — FENTANYL CITRATE (PF) 100 MCG/2ML IJ SOLN
50.0000 ug | Freq: Once | INTRAMUSCULAR | Status: AC
  Administered 2016-12-08: 50 ug via INTRAVENOUS

## 2016-12-08 MED ORDER — CALCIUM CARBONATE ANTACID 500 MG PO CHEW: 2.0000 | CHEWABLE_TABLET | ORAL | Status: DC | PRN

## 2016-12-08 MED ORDER — PROPOFOL 10 MG/ML IV BOLUS
INTRAVENOUS | Status: AC
  Filled 2016-12-08: qty 20

## 2016-12-08 MED ORDER — HYDRALAZINE HCL 20 MG/ML IJ SOLN: 5.0000 mg | INTRAMUSCULAR | Status: DC | PRN

## 2016-12-08 MED ORDER — ZOLPIDEM TARTRATE 5 MG PO TABS: 5.0000 mg | ORAL_TABLET | Freq: Every evening | ORAL | Status: DC | PRN

## 2016-12-08 MED ORDER — MIDAZOLAM HCL 2 MG/2ML IJ SOLN
2.0000 mg | INTRAMUSCULAR | Status: DC | PRN
  Administered 2016-12-08: 2 mg via INTRAVENOUS

## 2016-12-08 MED ORDER — SODIUM CHLORIDE 0.9 % IV SOLN
0.0000 mg/h | INTRAVENOUS | Status: DC
  Administered 2016-12-08 (×2): 6 mg/h via INTRAVENOUS
  Filled 2016-12-08: qty 10

## 2016-12-08 MED ORDER — DEXTROSE-NACL 5-0.9 % IV SOLN
INTRAVENOUS | Status: DC
  Administered 2016-12-08: 21:00:00 via INTRAVENOUS

## 2016-12-08 MED ORDER — DEXTROSE 5 % IN LACTATED RINGERS IV BOLUS: 1000.0000 mL | Freq: Once | INTRAVENOUS | Status: DC

## 2016-12-08 MED ORDER — FAMOTIDINE IN NACL 20-0.9 MG/50ML-% IV SOLN
20.0000 mg | Freq: Two times a day (BID) | INTRAVENOUS | Status: DC
  Administered 2016-12-08: 20 mg via INTRAVENOUS
  Filled 2016-12-08 (×2): qty 50

## 2016-12-08 MED ORDER — ACETAMINOPHEN 325 MG PO TABS: 650.0000 mg | ORAL_TABLET | ORAL | Status: DC | PRN

## 2016-12-08 MED ORDER — BETAMETHASONE SOD PHOS & ACET 6 (3-3) MG/ML IJ SUSP: 12.0000 mg | INTRAMUSCULAR | Status: DC

## 2016-12-08 MED ORDER — INSULIN ASPART 100 UNIT/ML ~~LOC~~ SOLN: 0.0000 [IU] | Freq: Three times a day (TID) | SUBCUTANEOUS | Status: DC

## 2016-12-08 MED ORDER — DEXTROSE 5 % IV SOLN
2.0000 g | Freq: Once | INTRAVENOUS | Status: AC
  Administered 2016-12-08: 2 g via INTRAVENOUS
  Filled 2016-12-08: qty 4

## 2016-12-08 MED ORDER — PROPOFOL 500 MG/50ML IV EMUL
0.0000 ug/kg/min | INTRAVENOUS | Status: DC
  Administered 2016-12-08: 40 ug/kg/min via INTRAVENOUS
  Administered 2016-12-08: 30 ug/kg/min via INTRAVENOUS
  Filled 2016-12-08: qty 50

## 2016-12-08 MED ORDER — PROPOFOL 10 MG/ML IV BOLUS
INTRAVENOUS | Status: AC | PRN
  Administered 2016-12-08: 100 mg
  Administered 2016-12-08: 100 mg via INTRAVENOUS
  Administered 2016-12-08: 15 mg
  Administered 2016-12-08: 50 mg via INTRAVENOUS
  Administered 2016-12-08: 100 mg

## 2016-12-08 MED ORDER — IOPAMIDOL (ISOVUE-370) INJECTION 76%
100.0000 mL | Freq: Once | INTRAVENOUS | Status: AC | PRN
  Administered 2016-12-08: 100 mL via INTRAVENOUS

## 2016-12-08 MED ORDER — NIFEDIPINE 10 MG PO CAPS
10.0000 mg | ORAL_CAPSULE | Freq: Once | ORAL | Status: AC
  Administered 2016-12-08: 10 mg via ORAL
  Filled 2016-12-08: qty 1

## 2016-12-08 MED ORDER — FENTANYL CITRATE (PF) 100 MCG/2ML IJ SOLN
INTRAMUSCULAR | Status: AC
  Filled 2016-12-08: qty 2

## 2016-12-08 MED ORDER — SUCCINYLCHOLINE CHLORIDE 20 MG/ML IJ SOLN
INTRAMUSCULAR | Status: AC | PRN
  Administered 2016-12-08: 100 mg via INTRAVENOUS

## 2016-12-08 MED ORDER — ACETAMINOPHEN 325 MG PO TABS
650.0000 mg | ORAL_TABLET | Freq: Four times a day (QID) | ORAL | Status: DC | PRN
  Administered 2016-12-08: 650 mg via ORAL
  Filled 2016-12-08: qty 2

## 2016-12-08 MED ORDER — MIDAZOLAM HCL 2 MG/2ML IJ SOLN
INTRAMUSCULAR | Status: AC
  Filled 2016-12-08: qty 2

## 2016-12-08 MED ORDER — BETAMETHASONE SOD PHOS & ACET 6 (3-3) MG/ML IJ SUSP
12.0000 mg | INTRAMUSCULAR | Status: DC
  Administered 2016-12-08: 12 mg via INTRAMUSCULAR
  Filled 2016-12-08: qty 2

## 2016-12-08 MED ORDER — SODIUM CHLORIDE 0.9 % IV SOLN
25.0000 ug/h | INTRAVENOUS | Status: DC
  Administered 2016-12-08: 50 ug/h via INTRAVENOUS
  Filled 2016-12-08: qty 40

## 2016-12-08 MED ORDER — MIDAZOLAM HCL 2 MG/2ML IJ SOLN: 2.0000 mg | INTRAMUSCULAR | Status: DC | PRN

## 2016-12-08 MED ORDER — ORAL CARE MOUTH RINSE: 15.0000 mL | Freq: Four times a day (QID) | OROMUCOSAL | Status: DC

## 2016-12-08 MED ORDER — SODIUM CHLORIDE 0.9 % IV SOLN
INTRAVENOUS | Status: DC
  Administered 2016-12-08: 0.5 [IU]/h via INTRAVENOUS
  Filled 2016-12-08: qty 1

## 2016-12-08 MED ORDER — FENTANYL BOLUS VIA INFUSION
25.0000 ug | INTRAVENOUS | Status: DC | PRN
  Filled 2016-12-08: qty 25

## 2016-12-08 MED ORDER — FENTANYL CITRATE (PF) 100 MCG/2ML IJ SOLN
100.0000 ug | INTRAMUSCULAR | Status: DC | PRN
  Administered 2016-12-08 (×2): 100 ug via INTRAVENOUS
  Filled 2016-12-08: qty 2

## 2016-12-08 MED ORDER — SODIUM CHLORIDE 0.9 % IV SOLN
INTRAVENOUS | Status: DC
  Administered 2016-12-08 (×2): 100 mL via INTRAVENOUS
  Administered 2016-12-08: 20:00:00 via INTRAVENOUS

## 2016-12-08 MED ORDER — SUCCINYLCHOLINE CHLORIDE 200 MG/10ML IV SOSY
PREFILLED_SYRINGE | INTRAVENOUS | Status: AC
  Filled 2016-12-08: qty 10

## 2016-12-08 MED ORDER — CHLORHEXIDINE GLUCONATE 0.12% ORAL RINSE (MEDLINE KIT): 15.0000 mL | Freq: Two times a day (BID) | OROMUCOSAL | Status: DC

## 2016-12-08 MED ORDER — SODIUM BICARBONATE 8.4 % IV SOLN
50.0000 meq | Freq: Once | INTRAVENOUS | Status: AC
  Administered 2016-12-08: 50 meq via INTRAVENOUS

## 2016-12-08 MED ORDER — LACTATED RINGERS IV SOLN
INTRAVENOUS | Status: DC
  Administered 2016-12-08: 17:00:00 via INTRAVENOUS

## 2016-12-08 MED ORDER — NIFEDIPINE 10 MG PO CAPS
20.0000 mg | ORAL_CAPSULE | Freq: Once | ORAL | Status: AC
  Administered 2016-12-08: 20 mg via ORAL
  Filled 2016-12-08: qty 2

## 2016-12-08 MED ORDER — FAMOTIDINE IN NACL 20-0.9 MG/50ML-% IV SOLN
20.0000 mg | Freq: Once | INTRAVENOUS | Status: AC
  Administered 2016-12-08: 20 mg via INTRAVENOUS
  Filled 2016-12-08: qty 50

## 2016-12-08 MED ORDER — PROPOFOL 500 MG/50ML IV EMUL
INTRAVENOUS | Status: AC
  Filled 2016-12-08: qty 50

## 2016-12-08 NOTE — MAU Provider Note (Signed)
Medical screen done CBC, CMP, PCR placed UA Fetal fibronectin collected Cervix: Dilation: 1 Effacement (%): Thick Cervical Position: Posterior Exam by:: Blanche EastJ. Chade Pitner, NP   Dr. Dion BodyVarnado at the bedside and resumes care of the patient.   Venia Carbonasch, Caela Huot I, NP 12/08/2016 4:12 PM

## 2016-12-08 NOTE — Progress Notes (Signed)
Critical care MD and OBGYN remain at beside--MFM being consulted at this time to determine POC from OB standpoint--pt ventilated and NG tube in place--chest xray being ordered

## 2016-12-08 NOTE — Progress Notes (Deleted)
Pt's BG checked @ 1330 result = 84.  Pt. Offered [place] @ 2L for comfort, Dr. Idamae SchullerVarnardo in room. Pt. Did not report help from [place]. RN transported pt. To X-Ray and CTA at 1449.

## 2016-12-08 NOTE — Progress Notes (Signed)
[facility] at bedside at this time for transfer

## 2016-12-08 NOTE — Progress Notes (Signed)
Dr. Charlotta Newtonzan arrived and given report. Dr. Ludwig ClarksZander called back at 2208 to state transfer was accepted pending reassuring fetal tracing..  Dr. Charlotta Newtonzan was also on the call.  Fetal tracing was reassuring at that time.  No decelerations observed x 45 minutes.  Dr. Ludwig ClarksZander was updated on most recent labs and pending labs. I signed off. Dr. Charlotta Newtonzan spoke with PAL Berton LanForsyth to set up transfer. Family updated.

## 2016-12-08 NOTE — Consult Note (Signed)
PULMONARY / CRITICAL CARE MEDICINE   Name: Ashley Hester MRN: [id number] DOB: Jul 03, 1990    ADMISSION DATE:  12/08/2016 CONSULTATION DATE:  12/08/2016  REFERRING MD:  Dr. Dion Body  CHIEF COMPLAINT:  Acidosis  HISTORY OF PRESENT ILLNESS:   26 yo female with first pregnancy at 34 weeks presented with headache and progressive dyspnea.  She has hx of DM type I.  She was found to have severe anion gap metabolic acidosis.  Urine protein elevated.  CBC normal and only mild elevation in LFTs.  Triglyceride 724.  Lactic acid 1.1.  She required intubation for assisted ventilation due to severe acidosis.  PAST MEDICAL HISTORY :  She  has a past medical history of ADHD (attention deficit hyperactivity disorder); Asthma; Diabetes mellitus without complication (HCC); Dyspnea; PONV (postoperative nausea and vomiting); and UTI (lower urinary tract infection).  PAST SURGICAL HISTORY: She  has a past surgical history that includes wisdon teeth extraction; laparoscopy (10/13/2011); laparoscopic appendectomy (10/13/2011); and Appendectomy ([id number]).  No Known Allergies  No current facility-administered medications on file prior to encounter.    Current Outpatient Prescriptions on File Prior to Encounter  Medication Sig  . famotidine (PEPCID) 10 MG tablet Take 10 mg by mouth daily as needed for heartburn or indigestion.  . insulin aspart (NOVOLOG FLEXPEN) 100 UNIT/ML FlexPen Inject 5-15 Units into the skin 3 (three) times daily with meals.   . Prenatal Vit-Fe Fumarate-FA (PRENATAL MULTIVITAMIN) TABS tablet Take 1 tablet by mouth daily at 12 noon.    FAMILY HISTORY:  Her indicated that her mother is alive. She indicated that her father is alive. She indicated that the status of her maternal grandmother is unknown. She indicated that the status of her paternal uncle is unknown.    SOCIAL HISTORY: She  reports that she has never smoked. She has never used smokeless tobacco. She reports that she does not  drink alcohol or use drugs.  REVIEW OF SYSTEMS:   Negative except above.  VITAL SIGNS: BP 126/77   Pulse (!) 129   Temp 97.8 F (36.6 C) (Oral)   Resp 20   Ht 5\' 4"  (1.626 m)   Wt 158 lb (71.7 kg)   SpO2 100%   BMI 27.12 kg/m   HEMODYNAMICS:    VENTILATOR SETTINGS:    INTAKE / OUTPUT: I/O last 3 completed shifts: In: 1000 [IV Piggyback:1000] Out: -   PHYSICAL EXAMINATION: General: anxious Neuro: CN intact, normal strength HEENT: Pupils reactive, no sinus tenderness, no LAN Cardiovascular: regular, tachycardic Lungs: increased WOB, using accessory muscles, no wheeze/rales Abdomen: gravid uterus Musculoskeletal: no edema Skin: no rashes  LABS:  BMET  Recent Labs Lab 12/08/16 1405 12/08/16 1610 12/08/16 1836  NA 133* 130* 132*  K 4.5 4.1 4.3  CL 107 105 107  CO2 8* PENDING <7*  BUN 5* PENDING <5*  CREATININE 1.18* 1.16* 1.10*  GLUCOSE 87 99 99    Electrolytes  Recent Labs Lab 12/08/16 1405 12/08/16 1610 12/08/16 1836  CALCIUM 10.3 9.9 10.0    CBC  Recent Labs Lab 12/08/16 1405 12/08/16 1836  WBC 10.3 13.0*  HGB 14.0 14.1  HCT 39.9 39.8  PLT 210 207    Coag's No results for input(s): APTT, INR in the last 168 hours.  Sepsis Markers  Recent Labs Lab 12/08/16 1836  LATICACIDVEN 1.1    ABG  Recent Labs Lab 12/08/16 1710  PHART 7.211*  PCO2ART 10.6*  PO2ART 118*    Liver Enzymes  Recent Labs Lab 12/08/16  1405 12/08/16 1836  AST 45* 42*  ALT 58* 56*  ALKPHOS 135* 131*  BILITOT 1.2 0.9  ALBUMIN 3.3* 3.3*    Cardiac Enzymes No results for input(s): TROPONINI, PROBNP in the last 168 hours.  Glucose  Recent Labs Lab 12/08/16 1330 12/08/16 2032  GLUCAP 84 153*    Imaging Dg Chest 2 View  Result Date: 12/08/2016 CLINICAL DATA:  [redacted] weeks pregnant, sudden onset of shortness, LEFT lower chest pain, history of diabetes, asthma EXAM: CHEST  2 VIEW COMPARISON:  None. FINDINGS: Jewelry artifact. Normal heart  size, mediastinal contours, and pulmonary vascularity. Lungs clear. No pleural effusion or pneumothorax. Bones unremarkable. IMPRESSION: Normal exam. Electronically Signed   By: Ulyses SouthwardMark  Boles M.D.   On: 12/08/2016 15:11   Ct Angio Chest Pe W And/or Wo Contrast  Result Date: 12/08/2016 CLINICAL DATA:  26 year old female with sudden onset of shortness of breath and lower left anterior chest pain. EXAM: CT ANGIOGRAPHY CHEST WITH CONTRAST TECHNIQUE: Multidetector CT imaging of the chest was performed using the standard protocol during bolus administration of intravenous contrast. Multiplanar CT image reconstructions and MIPs were obtained to evaluate the vascular anatomy. CONTRAST:  100 mL of Isovue 370. COMPARISON:  No priors. FINDINGS: Comment: Study is slightly limited by respiratory motion which limits assessment for predominantly distal segmental and subsegmental sized pulmonary arteries. Cardiovascular: There are no filling defects within the central, lobar or proximal segmental sized pulmonary arterial tree to suggest clinically relevant pulmonary embolism. More distal segmental and subsegmental sized emboli cannot be entirely excluded secondary to respiratory motion. Heart size is normal. There is no significant pericardial fluid, thickening or pericardial calcification. No significant atherosclerotic disease noted in the thoracic aorta. No coronary artery calcifications. Mediastinum/Nodes: No pathologically enlarged mediastinal or hilar lymph nodes. Esophagus is unremarkable in appearance. No axillary lymphadenopathy. Lungs/Pleura: No acute consolidative airspace disease. No pleural effusions. No suspicious appearing pulmonary nodules or masses. Upper Abdomen: Fundus of the gravid uterus is incompletely visualized. Right-sided hydroureteronephrosis also incompletely visualized, likely secondary to compression of the distal right ureter from the gravid uterus. Musculoskeletal: There are no aggressive appearing  lytic or blastic lesions noted in the visualized portions of the skeleton. Review of the MIP images confirms the above findings. IMPRESSION: 1. Despite the limitations of today's examination there is no evidence of central, lobar or proximal segmental sized pulmonary embolism. 2. No acute findings are noted in the thorax to account for the patient's symptoms. 3. Right-sided hydroureteronephrosis incompletely visualized, likely secondary to distal right ureteral compression from the gravid uterus. Electronically Signed   By: Trudie Reedaniel  Entrikin M.D.   On: 12/08/2016 15:33   Dg Chest Port 1 View  Result Date: 12/08/2016 CLINICAL DATA:  Shortness of breath.  Thirty-four weeks pregnant. EXAM: PORTABLE CHEST 1 VIEW COMPARISON:  Chest radiograph earlier today FINDINGS: An endotracheal tube is identified with tip 2.6 cm above the carina. An NG tube is present entering the stomach. Mild pulmonary vascular congestion is noted. There is no evidence of focal airspace disease, pulmonary edema, suspicious pulmonary nodule/mass, pleural effusion, or pneumothorax. No acute bony abnormalities are identified. IMPRESSION: Support apparatus as described with mild pulmonary vascular congestion. Electronically Signed   By: Harmon PierJeffrey  Hu M.D.   On: 12/08/2016 20:44     STUDIES:  CT angio chest 6/26 >> no PE  CULTURES: Blood 6/26 >> Urine 6/26 >>  ANTIBIOTICS:  SIGNIFICANT EVENTS: 6/26 Admit  LINES/TUBES: ETT 6/26 >>   DISCUSSION: 26 yo female with 34 weeks pregnancy  and hx of DM type 1 with severe anion gap metabolic acidosis and hypertriglyceridemia.  Intubated for assisted ventilation due to work of breathing.  ASSESSMENT / PLAN:  Anion gap metabolic acidosis. - likely DKA >> no other clear explaination - 2 liters of NS over next 2 hours - D5 NS after this - continue insulin gtt for at least 12 hours after anion gap closed - f/u BMET q4h - monitor K, Mg, Ph closely - f/u cultures  Acute respiratory  failure due to metabolic acidosis. - full vent support - f/u ABG - limit chest imaging studies as able in setting of pregnancy  Hypertriglyceridemia. - d/c diprivan (but level was drawn prior to starting diprivan) - check lipase  DVT prophylaxis - SCDs SUP - Pepcid Nutrition - NPO Goals of care - full code  CC time 100 minutes  D/w Dr. York Cerise, MD [facility] Pulmonary/Critical Care 12/08/2016, 9:05 PM Pager:  [phone] After 3pm call: [phone]

## 2016-12-08 NOTE — Anesthesia Procedure Notes (Signed)
Procedure Name: Intubation Date/Time: 12/08/2016 7:09 PM Performed by: Montez Hageman Pre-anesthesia Checklist: Patient identified, Emergency Drugs available, Suction available and Patient being monitored Patient Re-evaluated:Patient Re-evaluated prior to inductionOxygen Delivery Method: Circle system utilized Preoxygenation: Pre-oxygenation with 100% oxygen Intubation Type: IV induction, Rapid sequence and Cricoid Pressure applied Ventilation: Mask ventilation without difficulty Laryngoscope Size: Mac and 3 Grade View: Grade I Tube type: Oral Tube size: 7.0 mm Number of attempts: 1 Airway Equipment and Method: Stylet Placement Confirmation: ETT inserted through vocal cords under direct vision,  positive ETCO2,  CO2 detector and breath sounds checked- equal and bilateral Secured at: 20 cm Tube secured with: Tape

## 2016-12-08 NOTE — Progress Notes (Signed)
1913 patient intubated. 1938 NG tube placed by E. Rolley SimsHampton RN. 1946 Beside US to confirm fetal heart rate. 1951 patient placed on portable vent. 1956 chest x-ray. 2001 foley catheter placed by Foye ClockS. Anaika Santillano RN.

## 2016-12-08 NOTE — MAU Provider Note (Addendum)
Ashley Hester is a 26 y.o. female G1 @ 34 0/7 weeks h/o Type I Diabetes well controlled on an Insulin pump with c/o SOB. Pt states SOB started overnight.  First noticed it when she was walking around in [employer].  Pt states when she was sitting on the couch this morning she still felt SOB.  Pt has a remote h/o exercise induced asthma. Currently denies wheezing.  Pt denies significant swelling. Pt did not feel contractions, denies LOF or vaginal bleeding. After reviewing tocometry and frequency was able to feel a tightening.  On my arrival, MAU provider had ordered labs and done a cervical exam.  Per provider, cervix was 1 cm but very thick, not soft and very posterior.  FFN had been ordered.  UA with 80 ketones.  IVF bolus had been ordered and Procardia.  Pt reports she has been drinking a lot of water.  Pt states she was diagnosed with Type I DM 2 years ago Hg A1C 5.7 in 04/2016. States her CBGs have mostly been around less than 100 with the use of her insulin pump.    Pt's ob care has been with [facility] (Dr. Ozan) and was o/w uncomplicated.  She has also been followed Dr. Kerr, endocrinologist.          OB History    Gravida Para Term Preterm AB Living   1 0 0 0 0 0   SAB TAB Ectopic Multiple Live Births   0 0 0 0           Past Medical History:  Diagnosis Date  . ADHD (attention deficit hyperactivity disorder)   . Asthma    sport induced  . Diabetes mellitus without complication (HCC)   . PONV (postoperative nausea and vomiting)    Hard to wake up  . UTI (lower urinary tract infection)         Past Surgical History:  Procedure Laterality Date  . APPENDECTOMY  [id number]  . LAPAROSCOPIC APPENDECTOMY  10/13/2011   Procedure: APPENDECTOMY LAPAROSCOPIC;  Surgeon: Matthew Wakefield, MD;  Location: [facility] ORS;  Service: General;  Laterality: N/A;  . LAPAROSCOPY  10/13/2011   Procedure: LAPAROSCOPY DIAGNOSTIC;  Surgeon: Matthew Wakefield, MD;  Location: [facility] ORS;   Service: General;  Laterality: N/A;  . wisdon teeth extraction     Family History: family history includes Cancer in her maternal grandmother and paternal uncle; Crohn's disease in her maternal grandmother; Diabetes in her paternal uncle; Hypertension in her mother. Social History:  reports that she has never smoked. She has never used smokeless tobacco. She reports that she does not drink alcohol or use drugs.     Maternal Diabetes: Yes:  Diabetes Type:  Pre-pregnancy, Insulin/Medication controlled Genetic Screening: Quad screen 1:220. Repeat AFP normal.  ROA ultrasound normal Maternal Ultrasounds/Referrals: Normal Fetal Ultrasounds or other Referrals:  None Maternal Substance Abuse:  No Significant Maternal Medications:  Meds include: Other:  Significant Maternal Lab Results:  None Other Comments:  None  ROS Maternal Medical History:  Reason for admission: SOB at rest,  Contractions: Perceived severity is mild.    Fetal activity: Perceived fetal activity is decreased.    Prenatal Complications - Diabetes: type 1. Diabetes is managed by insulin pump.     Blood pressure 136/77, pulse (!) 109, temperature 98.1 F (36.7 C), temperature source Oral, resp. rate (!) 24, SpO2 100 %. Maternal Exam:  Uterine Assessment: Contraction frequency is regular.   Abdomen: Patient reports no abdominal tenderness. Introitus: not evaluated.     Cervix: Cervix evaluated by digital exam.   1/thick, posterior per MAU provider  Fetal Exam Fetal Monitor Review: Variability: moderate (6-25 bpm).   Pattern: accelerations present and no decelerations.    Fetal State Assessment: Category I - tracings are normal.    Physical Exam  Constitutional: She is oriented to person, place, and time. She appears well-developed and well-nourished. No distress.  HENT:  Head: Normocephalic and atraumatic.  Eyes: EOM are normal.  Neck: Normal range of motion.  Cardiovascular: Regular rhythm  and normal heart sounds.   Respiratory: Breath sounds normal. She is in respiratory distress. She has no wheezes.  GI: There is no tenderness.  Musculoskeletal: Normal range of motion. She exhibits edema.  Neurological: She is alert and oriented to person, place, and time.  Skin: Skin is warm and dry.    Prenatal labs: ABO, Rh:  B+ Antibody:  Neg Rubella:  Immune RPR:   Nonreactive HBsAg:   Nonreactive HIV:   Nonreactive GBS:  Not collected due to gestational age.  Assessment/Plan: IUP @ 34 0/7 weeks SOB at rest.  Pt with obvious respiratory distress but O2 sats are normal. Preterm contractions. Type I Diabetes well controlled. Dehydration Reassuring fetal status. Normal BP.  R/o Preeclampsia.  CBC, CMP pending.  Chest Xray ordered to rule out pulmonary edema. CT scan ordered to rule out PE due to significant Tachypnea.   IVF bolus in progress and Procardia given.  F/u Fetal fibronectin.  Suspect contractions will improve s/p rehydration. F/u labs and imaging studies. BMZ. Pt and husband informed of plan and questions were answered. Likely admit for close observation.  Navjot Pilgrim 12/08/2016, 2:02 PM     

## 2016-12-08 NOTE — MAU Note (Signed)
Pt. Came in after 24 hrs plus of SOB. She was prompted to come in this afternoon after she described what was decreased fetal movement.  She had slept at an elevated level last night and was working to breath when sitting on the couch watching TV.  Described putting on her clothing as "like walking up a flight of stairs" She self regulates her BG with a continuous BG monitor on her left arm that reports levels to her cell phone. BG was "90" on the drive over. She also carb controls her diet.  Mild to moderate headache.

## 2016-12-08 NOTE — Progress Notes (Signed)
Care link at bedside for transfer- update on current status:   O: BP 118/65   Pulse (!) 119   Temp 98.6 F (37 C)   Resp 20   Ht 5\' 4"  (1.626 m)   Wt 158 lb (71.7 kg)   SpO2 100%   BMI 27.12 kg/m   FHT: 140, moderate variability, no accels no decels Toco: no contractions  ABG    Component Value Date/Time   PHART 7.187 (LL) 12/08/2016 2205   PCO2ART 11.3 (LL) 12/08/2016 2205   PO2ART 138 (H) 12/08/2016 2205   HCO3 4.1 (L) 12/08/2016 2205   TCO2 23 10/10/2011 2137   ACIDBASEDEF 23.3 (H) 12/08/2016 2205   O2SAT 99.3 12/08/2016 2205   CMP Latest Ref Rng & Units 12/08/2016 12/08/2016 12/08/2016  Glucose 65 - 99 mg/dL 829(F175(H) 99 99  BUN 6 - 20 mg/dL <6(O<5(L) <1(H<5(L) <0(Q<5(L)  Creatinine 0.44 - 1.00 mg/dL 6.57(Q1.10(H) 4.69(G1.10(H) 2.95(M1.16(H)  Sodium 135 - 145 mmol/L 133(L) 132(L) 130(L)  Potassium 3.5 - 5.1 mmol/L 4.4 4.3 4.1  Chloride 101 - 111 mmol/L 112(H) 107 105  CO2 22 - 32 mmol/L <7(L) <7(L) <7(L)  Calcium 8.9 - 10.3 mg/dL 8.4(X8.5(L) 32.410.0 9.9  Total Protein 6.5 - 8.1 g/dL - 7.5 -  Total Bilirubin 0.3 - 1.2 mg/dL - 0.9 -  Alkaline Phos 38 - 126 U/L - 131(H) -  AST 15 - 41 U/L - 42(H) -  ALT 14 - 54 U/L - 56(H) -     25yo G1P0@ [id number] admitted for metabolic acidosis of unknown cause-possibly DKA, history of Type 1 DM  Plan per critical care team (as outlined below)  Anion gap metabolic acidosis. -s/p 2 L NS, currently on D5NS @ 125cc/hr -IV insulin and IV Mag, s/p bicarb bolus -per consult, continue insulin gtt for at least 12 hours after anion gap closed - f/u BMET q4h, next BMP to be drawn @ 0230 -blood and urine culture pending  Acute respiratory failure due to metabolic acidosis. - full vent support - ABG as above  Hypertriglyceridemia -etiology unclear, lipase normal - possible due to recent nutritional changes  Fetal well being- reassuring  -s/p BMZ #1 @ [address]1628  Nakira Litzau, DO [phone] (cell)- covering 7a-7p After 7pm, please call physician on call for  [facility]

## 2016-12-09 LAB — VOLATILES,BLD-ACETONE,ETHANOL,ISOPROP,METHANOL

## 2016-12-09 MED FILL — Medication: Qty: 1 | Status: AC

## 2016-12-09 NOTE — Procedures (Signed)
-------------------------------------------------------------------------------   Attestation signed by Charmaine Sharps, RRT at 12/12/16 2344 I attest i was present for time called out for procedure -------------------------------------------------------------------------------  [facility] Procedure Note   Arterial Catheter Date/Time: 12/09/2016 7:01 AM Performed by: JORDIS LOVING Authorized by: LOVENIA REAL B   Consent:    Consent obtained:  Written   Consent given by:  Spouse Indications:    Indications: hemodynamic monitoring and multiple ABGs   Pre-procedure details:    Skin preparation:  2% Chlorhexidine  Sedation:    Sedation type: See MAR. Anesthesia (see MAR for exact dosages):    Anesthesia method: See MAR. Procedure details:    Location:  L radial   Allen's test performed: yes     Allen's test abnormal: yes     Needle gauge:  20 G   Placement technique:  Seldinger   Number of attempts:  2 Post-procedure details:    Was procedure successful?:  No Comments:     Arterial Line Placement Complications     Unable to place Catheter would not advance       LOVING JORDIS, RRT 12/09/2016 / 6:59 AM

## 2016-12-09 NOTE — Progress Notes (Signed)
 PULMONARY/CRITICAL CARE PROGRESS NOTE [facility]  Date of Admission:  12/09/2016 Length of Stay:  0 Days   HPI: Ashley Hester is a 26 year old Caucasian female with a past medical history remarkable for ADHD, asthma, urinary tract infection, type 1 diabetes mellitus, 34 week intrauterine gestation, initially admitted to [facility] with complaints of dyspnea, had severe metabolic acidosis, required intubation and after initial laboratory evaluation and workup was transferred here for further high risk OB/GYN in critical care management. Patient is unable to give me history most of this is taken from transfer, checkout and records but apparently she started a ketogenic diet recently. Per records she is not on metformin  but takes insulin  at home, pertinent labs reveal extremely low CO2, negative toxicology screen, acidemic pH on arterial blood gas. She arrives intubated, stable hemodynamics, easily arousable on Versed  and fentanyl  for sedation.   Interval History  6/27: ventilated on insulin  gtt. Need q2h labs per OB DKA protocol. abg still poor with pH 7.2, cont bicarb gtt and give 2 additional amps.  Problems Addressed Plan     Severe metabolic acidosis  / acid-base derangement  dka on insulin  gtt Recheck bmp per protocol.  On bicarb gtt.   Respiratory failure req mechanical ventilation.  Titrate vent for hopeful extubation as acid base disorder improves.   34 week intrauterine gestation  Per OB   bacteria and leuks in urine Recommend primary ordering cx for this. Esp in setting of dka Thankfully on empiric abx, however this may make repeat urine sample with cx more difficult to obtain species Pct 0.11  uds + benzo which is not listed as home med.   Hypertriglyceridemia, reported Likely 2/2 ketogenic diet. Avoid propofol                      PROCEDURES          Fluids, electrolytes, nutrition Fluids:bicarb 132ml/hr, monitor lytes and replete  prn  Diet: none   Goal RASS  -1  Sedation Medications  see orders  ICU Protocols  see orders  Prophylaxis Prophylaxis:    DVT: SCDs    GI:  Sup protocol   Ethics No Order   Disposition ICU   Discussed plan of care with RN, RT   Billing additional cc time spent coordinating care and ongoing titration of meds in this critically ill pt.  and 35 minutes of Critical Care Time spent sans procedures - [id number] [phone]    Current [facility] Problem List *(not all of which are being addressed by me)  There are no active hospital problems to display for this patient.   Objective   Vital signs in last 24 hours: Intake/Output:  Temp:  [98.2 F (36.8 C)-99.3 F (37.4 C)] 99 F (37.2 C) Heart Rate:  [100-128] 104 BP: (108-144)/(50-76) 119/62 SpO2:  [96 %-99 %] 96 % Weight: 173 lb 15.1 oz (78.9 kg)  RESPIRATORY/VENTILATOR DATA Vent Mode : AC/PC Resp Rate (Set): 14 Resp Rate (Observed): 16 FiO2: 30 % PEEP (Set): 5 Ve Observed (L/min): 14.1 L/min Vt (observed, mL): 874 mL SpO2: 96 %  Intake/Output Summary (Last 24 hours) at 12/09/16 0725 Last data filed at 12/09/16 0700  Gross per 24 hour  Intake          1493.38 ml  Output             1045 ml  Net           448.38 ml  Vital signs reviewed. General: well developed,female, intubated and sedated HEENT:  NCAT, EOMI, PERRLA, Sclear anicteric. Mucous membranes moist and pink Neck: supple, no lymphadenopathy.  No JVD Lungs: Clear to auscultation bilaterally CV:  Regular rhythm No m/g/r Abdomen: gravid, nontender, Extremities: no clubbing, cyanosis, + edema Skin: no rash.  warm, dry Neuro: sedated intubated   Culture Source Culture Data Antibiotics Day # Treatment Goal   6/27 urine pending zosyn 1   6/27 blood pending                                Recent: Labs:  Recent Labs Lab 12/09/16 0338 12/09/16 0628  WBC 13.6* 13.8*  HGB 11.4* 11.2*  HCT 33.1* 32.5*  PLT 179 174                                          Recent Labs Lab 12/09/16 0335 12/09/16 0424 12/09/16 0521 12/09/16 0620  NA 135*  --   --   --   K 4.3  --   --   --   CL 103  --   --   --   CO2 7*  --   --   --   BUN 4*  --   --   --   CREATININE 1.00  --   --   --   GLUCOSE 164* 167* 187* 172*  CALCIUM  8.8  --   --   --   AST 36  --   --   --   ALT 44*  --   --   --   ALKPHOS 112  --   --   --   ALBUMIN  3.1*  --   --   --     Recent Labs Lab 12/09/16 0332  LACTACID 0.9   No results for input(s): CK, TROPONIN in the last 168 hours.  Invalid input(s): MB No results for input(s): AMYLASE, LIPASE in the last 168 hours.  Recent Labs Lab 12/09/16 0333  PTT 26  PT 9.7  INR 1.0   No results found for this or any previous visit (from the past 72 hour(s)).   Recent Results (from the past 48 hour(s))  Blood Gas, Arterial on current oxygen level   Collection Time: 12/09/16  2:09 AM  Result Value Ref Range   PH ARTERIAL 7.13 (LL) 7.35 - 7.45   PCO2 20 (LL) 35 - 45   PO2 ARTERIAL 165 (H) 80 - 100 mmHg   HCO3 7 (LL) 21 - 28 mEq/L   BASE EXCESS -21 (L) -2 - 2 mmol/L   OXYGEN SATURATION ARTERIAL 99.0 80.0 - 100.0 %   FRACTION OF INSPIRED OXYGEN 30 %   VENTILATION MODE ARTERIAL ACPC    VT SET/TARGET 670.0000    VENTILATION RATE 14.0000    TOTAL RATE ART 20.0000    PEAK INSPIRATORY PRESSURE ARTERIAL 23.0000 cmH20   MAP ARTERIAL 10.0000 CAE   ITIME/%ITIME 1.0000    MINUTE VOLUME ARTERIAL 8.5000    PE/CP/EP 5.0000    INSPIRATORY PRESSURE 14.0000    PATIENT TEMPERATURE 98.6000 Deg   ALLENS TEST yes    RESPIRATORY CARE NUMBER OF STICKS [facility] 1.0    CALLED RESULTS Eddie P, RCP    DATE TIME CALLED 12/09/2016    SITE LR  Pertinent Radiographic Data: Xr Chest Ap Portable  Result Date: 12/09/2016 TECHNIQUE: AP view of the chest. INDICATION: Shortness of breath FINDINGS: The tip of the endotracheal tube projects 3 cm above the carina. An NG tube is in place with tip located in  the body the stomach. An esophageal temperature probe is in place with the tip located in the distal esophagus. The cardiomediastinal silhouette is within normal limits. The pulmonary vasculature is within normal limits. There is mild diffuse haziness of lungs. There are no obvious pleural effusions. No pneumothorax is identified.   IMPRESSION: Nonspecific haziness of the lungs which may represent an underlying pneumonitis. Satisfactory placement of endotracheal tube and NG tube.   Scheduled Meds: . lidocaine  1% (PF)  0.5 mL Intradermal ONCE  . NaCl  1,000 mL IntraVENous ONCE  . NaCl  10 mL Intracatheter 2 times per day  . piperacillin-tazabactam  3.375 g IntraVENous Q8H   Continuous Infusions: . fentaNYL  75 mcg/hr (12/09/16 0700)  . insulin  regular 100 units/100 mL NS infusion 1.4 Units/hr (12/09/16 0700)  . midazolam  4 mg/hr (12/09/16 0700)  . NaCl    . NaCl    . sodium bicarbonate  infusion     PRN Meds:.dextrose  **OR** dextrose  **OR** glucagon, NaCl, NaCl **AND** NaCl  This note was dictated with voice recognition software. Similar sounding words can inadvertently be transcribed and may not be corrected upon review, despite my absolute best efforts. Harlene LITTIE Na, DO 12/09/2016 / 7:25 AM

## 2016-12-09 NOTE — Progress Notes (Addendum)
[facility]

## 2016-12-09 NOTE — Progress Notes (Signed)
Patient ID: Ashley Hester, female   DOB: 11/24/1990, 26 y.o.   MRN: [id number]  Late entry due to direct pt care/coordination of care.  Sequence of events follow.  Times not available.  Chest Xray and Chest CT were negative for pulmonary edema and PE.  LFTs very mildly elevated, platelets 200s.  PCR 1.17, Cr 1.18.  Preeclampsia suspected but significant SOB was atypical.  Glucose remained 80s on CMP. Admit orders done.  BMZ ordered.  Spoke with MFM, Dr. Ezzard StandingNewman.  He did not feel the pt and Preeclampsia and strongly advised Cardio and pulmonology be consulted.  Recommend 24 hour urine protein.  In the interim, [name], MAU provider, called to state pt's HR would increase to 140s.  She was ordering an EKG to rule out SVT.  Dr. Corliss BlackerByram paged by me and MAU.  Spoke with him through [facility] and case reviewed.  Felt pt was in metabolic acidosis and needed to be evaluated. Recommended continuous telemetry.  He was informed [facility].  He stated he would send a team member over.  Upon my arrival [facility], arrangements were being made to get telemetry. Pt still with an increased respiratory rate.  Stated that O2 did not make her feel better. Dr. Soon arrived shortly after me to evaluate pt.  Recommended intubation.    After intubation, I spoke with Drs. Katherina Rightenny ([facility] at [facility]), Bunce ([facility] Ludwig ClarksZander (on call ob[facility]Gyn at [facility]).  MFMs strongly felt pt was in DKA despite normal blood sugar.  Dr. Katherina Rightenny verbally gave recommendations and emailed an approved DKA protocol for pregnancy.  Dr. Soon was on call as well to hear the differences in management in pregnancy. Dr. Katherina Rightenny informed pt had a deceleration after intubation.  He strongly advised against delivery of fetus while pt is in DKA.  Would not recommend transfer if baby was not stable due to high risk of stillbirth.  Dr. Archie PattenBunce would be shown the [facility] and  agreed fetal status was reassuring after deceleration.    Dr. Soon [facility] but pt needed continuous fetal monitoring.  Rapid response RN could not remain at bedside. Discussed system issues with multiple team members.  Dr. Soon outlined what he needed for pt care. It was finally decided [facility] and have NP come over to do nursing care. Pt was then transferred to AICU.  Once pt was in AICU, she was improving per Critical care's standpoint.  Fetal status was reassuring.  I was notified by house supervisor, [name], that she could not guarantee an RN to assume pt care at 7 am.  [facility] was made and Drs. Mardella Laymanzan, Bunce and LattyZander were notified.  [name] and at bedside in AICU.

## 2016-12-09 NOTE — Consults (Addendum)
 PULMONARY/CRITICAL CARE CONSULTATION [facility] Date of Encounter: 12/09/2016  PATIENT INFORMATION NAME:Ashley Hester AGE:26 y.o. DOB:[birth year]) DZK:[id number] Consult requested by OB/GYN Date of Admission:  12/09/2016 Length of Stay:  0 Days  CHIEF COMPLAINT/REASON FOR CONSULT: Patient intubated, metabolic acidosis CONSULTING PHYSICIAN:  Norleen Cease, D.O. PRIMARY CARE PHYSICIAN:No primary care provider on file. Date of Consultation: 12/09/2016   Problems Addressed Plan     Severe metabolic acidosis  / acid-base derangement  Will repeat labs, obtain chest x-ray which shielded abdomen to confirm tube placement and to evaluate for pneumonia, arterial blood gas, BMP, CBC, lactic acid, serum ketones, beta hydroxybutyrate, urinalysis, blood and sputum cultures, pro-calcitonin. At this point we'll hold on antibiotics until we see the results of our studies. Patient is presently on insulin  infusion With elevated beta hydroxybutyrate, empiric DKA coverage, will place on Gluccomander.   Difficult to calculate acid-base derangement secondary to range in bicarbonate less than 7. Anion gap ranges anywhere from 14-21 at best patient has both an anion gap and non-anion gap metabolic acidosis with superimposed respiratory alkalosis based on Winters formula consistent with a triple base disorder   Respiratory failure  Will place on mechanical ventilation protocol, U chest x-ray and arterial blood gas. Most likely the increased work of breathing is secondary to the increased minute ventilation requirements from significant metabolic acidosis   34 week intrauterine gestation  Per OB             Critical care time 35 minutes, respiratory failure, severe metabolic acidosis                                           Fluids, electrolytes, nutrition Fluids: none, monitor lytes and replete prn, Diet: none  Prophylaxis Prophylaxis:    DVT: none   GI:  none  Ethics:  No  Order     Subjective   HPI:Mrs. Ashley Hester is a 26 year old Caucasian female with a past medical history remarkable for ADHD, asthma, urinary tract infection, type 1 diabetes mellitus, 34 week intrauterine gestation, initially admitted to [facility] with complaints of dyspnea, had severe metabolic acidosis, required intubation and after initial laboratory evaluation and workup was transferred here for further high risk OB/GYN in critical care management. Patient is unable to give me history most of this is taken from transfer, checkout and records but apparently she started a ketogenic diet recently. Per records she is not on metformin  but takes insulin  at home, pertinent labs reveal extremely low CO2, negative toxicology screen, acidemic pH on arterial blood gas. She arrives intubated, stable hemodynamics, easily arousable on Versed  and fentanyl  for sedation.  There are no active problems to display for this patient.  No past medical history on file. No past surgical history on file. Social History   Social History  . Marital status: N/A    Spouse name: N/A  . Number of children: N/A  . Years of education: N/A   Social History Main Topics  . Smoking status: Not on file  . Smokeless tobacco: Not on file  . Alcohol use Not on file  . Drug use: Unknown  . Sexual activity: Not on file   Other Topics Concern  . Not on file   Social History Narrative  . No narrative on file   No family history on file. Allergies not on file    There is no  immunization history on file for this patient.  Past Medical History, Past Surgery History, Social History, and Family History, Allergies, and Immunizations were reviewed and updated.    REVIEW OF SYSTEMS: .Review of systems not obtained due to patient factors.  MEDICATIONS: Home Medications:  Prior to Admission medications   Not on File   Scheduled Medications: Continuous Infusions: PRN Medications:SABRA   MAR PERSONALLY  REVIEWED  Objective   VITAL SIGNS: INTAKE/OUTPUT        RESPIRATORY: No Data Recorded No Data Recorded        No intake or output data in the 24 hours ending 12/09/16 0151  No intake/output data recorded.  No intake/output data recorded.   HEMODYNAMICS    PHYSICAL EXAM Patient is awake, responsive, orally intubated on mechanical ventilation Vital signs: Please see the above listed vital signs HEENT: Patient orally intubated, trachea is midline, no stridor or tracheal deviation noted, no oral lesions appreciated, no jugular venous distention Cardiovascular: Regular rate and rhythm Pulmonary: Clear to auscultation Abdominal: Intrauterine gestation records 34 weeks pending OB/GYN for fetal heart tone monitoring Extremity: Trace edema Neurologic: No clear focal deficits noted however limited exam patient on sedation Cutaneous: No rashes or lesions noted  Data   Culture Source Culture Data Antibiotics Day #  Treatment Goal                      Labs:  No results for input(s): WBC, HGB, HCT, PLT in the last 168 hours.     Invalid input(s): SEGSABS, BANDABS, BLAST    No results for input(s): NA, K, CL, CO2, BUN, CREATININE, GLUCOSE, CALCIUM , PHOS, LDH, AST, ALT, ALKPHOS, BILITOT, PROTEIN, ALBUMIN , PROCALCITONI, CORTISOL, LACTACID, LACACIDART, LACTACIDVENT, LACTACIDCSF, BNP, AMMONIA in the last 168 hours.  Invalid input(s): MG  CrCl cannot be calculated (No order found.).  No results for input(s): CK, TROPONIN in the last 168 hours.  Invalid input(s): MB  No results for input(s): AMYLASE, LIPASE in the last 168 hours.  No results for input(s): PTT, PT, INR, ANTIXA in the last 168 hours.  No results found for this or any previous visit (from the past 72 hour(s)).  OTHER LABS: Outside labs remarkable for sodium 133, potassium 4.4, chloride 112, CO2 less than 7, glucose 175, BUN less than 5, creatinine 1.1, arterial blood gas reveals a pH of 7.187, PCO2 of 11, PaO2  of 138, elevated beta hydroxybutyrate, ethyl alcohol less than 5, salicylate less than 7, magnesium  1.9, phosphorus 23.4, lipase 32, triglycerides 724, HIV negative, rubella immune, hepatitis B surface antigen negative, opiates cocaine benzos and amphetamines THC and barbiturates nondetected, lactic acid 1.1, white count 13, hemoglobin 14.1, hematocrit 39.8, chest x-ray revealed mild vascular congestion, CT and she'll of the chest revealed limited study, no evidence of central lobar or proximal segmental sized pulmonary embolus, no acute findings in the thorax, right sided hydroureter nephrosis most likely secondary to distal ureteral compression from gravid uterus  ABG: No results found for this or any previous visit (from the past 72 hour(s)).  Blood Gas Results (Last 72 Hours) No results found for: PH ART, PCO2 ART, PO2 ART, HCO3 ART, BASE EXC ART, O2 SAT ART, PH VEN, PO2 VEN, PCO2 VEN, HCO3 VEN, BASE EXC VEN, O2 SAT VEN  New Microbiological Data in Last 72 hrs: Microbiology Results    ID Description Status Collection Date/Time   [id number] MRSA PCR Nares Nares   --   [id number] Culture, Blood Blood Peripheral  Needs to  be Collected --   [id number] Culture, Blood Blood Peripheral  Needs to be Collected --   [id number] Culture, Respiratory Sputum Sputum   --       New Radiographic Data in Last 3 days: No results found.  RADIOGRAPHIC STUDIES PERSONALLY REVIEWED  Disposition ICU  Discussed plan of care with OB / GYN   RECORD REVIEW old medical records.  This note was dictated with voice recognition software. Similar sounding words can inadvertently be transcribed and may not be corrected upon review.  ELECTRONICALLY SIGNED Norleen Cease, MD  12/09/2016 1:51 AM

## 2016-12-09 NOTE — H&P (Signed)
 ------------------------------------------------------------------------------- Attestation signed by Suzen JONETTA Blind, MD at 12/09/16 [phone] I saw and evaluated the patient and reviewed the resident's note.  I agree with the resident's findings and plan.   -------------------------------------------------------------------------------  OBSTETRICS HISTORY AND PHYSICAL  CHIEF COMPLAINT: Acidosis in pregnancy   DIAGNOSIS: 1. IUP at 34+2 weeks 2. Metabolic acidosis 3. Type 1 DM 4. Asthma  ASSESSMENT/PLAN   26 y.o. G1 female at 34+2 weeks with a pregnancy complicated by:   # Metabolic acidosis, DKA most likely etiology - pH 7.13, pCO2 20, PO2 165, HCO3 7, BE -21 on arrival to the ICU - AG has been elevated at OSH, repeat has been ordered - Patient was intubated at the OSH in an attempt to correct her acidosis; however the acidosis has persisted - She remains intubated with ventilator support, we appreciate pulmonology's assistance with this - FSBG values 110s - 170s at OSH, insulin  given at OSH - POS urine/serum ketones - Given her recent aggressive diet (60 carbs per day x 1 month) and likely resultant baseline ketotic state, T1DM and pregnancy, DKA is most likely etiology. It is possible to experience DKA in pregnancy with relatively normal glucose levels. - Hypertriglyceridemia (TG > 700) likely a result of DKA in pregnancy; unlikely to be related to propofol  administration given the timing - She likely remains persistently ketotic and acidotic because of insufficient fluid resuscitation (~2L LR) at the OSH - We will aggressively hydrate her (6L over first 12 hours) and maintain the IV glucose protocol - After discussion with Dr. Estrella, a bicarb drip will also be started - Continuous fetal monitoring and L&D nurses present in her room - We will be patient with the tracing (currently Cat 2) as mom has medical issues and thus needs a medical, not surgical intervention. The baby's  status will improve once mom's acid/base status improves. A crash C/S kit is present in the room, but delivery would only be indicated for Cat 3 tracing/imminent fetal demise. - Labs q 2 hours, urine ketones hourly until negative - Infectious etiology less likely, but broad antibiotic coverage will be started with Zosyn until clinical picture becomes more clear  # Elevated BPs - Several elevated BPs were documented in her outside record, however, no formal diagnosis can be made at this time - No elevated BPs > 4 hours apart; however, she did apparently receive anti-hypertensives (Procardia  20/10) at the OSH + Mag 2g bolus x 1 - Her Cr was elevated at 1.1 at the OSH; however given her BUN/Cr (<20), volume status and clinical scenario this could be secondary to severe dehydration - Her current BPs are 110s/60s, no obvious sequelae of PreE - AST/ALT 40s/50s, Platelets low 200s - We will hold on magnesium  for now and continue to trend BPs, labs and UOP  # Fetal Wellbeing  - Fetal tracing: Cat 2 - single late deceleration present, min-mod variability, no accels - Ultrasound: reviewed as below - BMZ: #1 given at 1500 6/26 at the OSH, will discuss repeat dosing with MFM team today - Group B Streptococcus: unknown  - NICU consult: ordered  # Routine Obstetrics - Labs unavailable, will work on records today - MBT: B+  HISTORY OF PRESENT ILLNESS   26 y.o. G1 female at 34+2 weeks who presents as a transfer from [facility] for worsening acidosis. She initially presented with worsening fatigue, SOA, labile glucose levels and decreased fetal movement. She experienced a rapid decline and ultimately required intubation to adequately manage her respiratory and acid/base status.  She is currently stable upon arrival from [place] as is her baby. She remains intubated on the ventilator.  Her husband reports that she recently started an aggressive low-carb (60 carbs per day) diet and has lost 12 lbs this month. She  started the diet to address increasing insulin  requirements. She did not consult with any physicians prior to or during this diet. He reports that she is exclusively taking Novolog  insulin  currently. She takes frequent FSBG levels at home and givens insulin  before and sometimes after meals. He reports that her levels have been more erratic of late. Her most recent Hgb A1C was ~ 6.5% per the husband.  REVIEW OF SYSTEMS: A complete review of systems was performed and was specifically negative for headache, changes in vision, RUQ pain, shortness of breath, chest pain, lower extremity edema and dysuria.   HISTORY: Obstetrical History: G1 - current  Past Medical History: Past Medical History:  Diagnosis Date  . Diabetes mellitus (*)     Past Surgical History: History reviewed. No pertinent surgical history.  Social History: Social History  Substance Use Topics  . Smoking status: Never Smoker  . Smokeless tobacco: Never Used  . Alcohol use Not on file    Family History History reviewed. No pertinent family history.    OBJECTIVE  VITALS: Temp:  [98.2 F (36.8 C)-99.3 F (37.4 C)] 98.2 F (36.8 C) Heart Rate:  [100-128] 100 BP: (108-122)/(50-65) 114/61 SpO2:  [97 %-98 %] 97 %  PHYSICAL EXAM: GENERAL: Intubated/sedated CHEST: CTAB CV: RRR, WWP ABDOMEN: Gravid, nontender EXTREMITIES:  Warm and well-perfused, nontender, nonedematous NEURO: AAO x 3, CN II-XII grossly intact, 2+ DTRs, - clonus CERVIX: 1/ 0/ -3 (OSH)  Fetal Monitoring:   Cat 2 - single late deceleration present, min-mod variability, no accels  Allergies: No Known Allergies  Home Meds: Current Home Medications   Medication Sig Last Dose  famotidine  (PEPCID ) 20 MG tablet Take 20 mg by mouth 2 (two) times a day as needed for Heartburn.     DIAGNOSTIC STUDIES: No results for input(s): WBC, HGB, HCT, PLT, NA, K, CL, CO2, BUN, CREATININE, GLUCOSE, CALCIUM , AST, ALT, UA in the last 168 hours.  Invalid  input(s): LABALB, MG  Results for orders placed or performed during the hospital encounter of 12/09/16 (from the past 24 hour(s))  Urine Drugs of Abuse Screen   Collection Time: 12/09/16  1:56 AM  Result Value Ref Range   Ur PH DOA Scr 5.0 4.5 - 9.0   Amphet Scr Negative Negative   Barb Scr Negative Negative   Benzo Scr Positive (A) Negative   Cannab Scr Negative Negative   Cocaine Scr Negative Negative   Opiates Scr Negative Negative   Meth Scr Negative Negative   Oxyco Scr Negative Negative  Urinalysis with Possible Microscopy   Collection Time: 12/09/16  1:56 AM  Result Value Ref Range   Urine Color Yellow Yellow    Urine Appearance Cloudy (A) Clear   Urine Specific Gravity 1.019 1.005 - 1.030   Urine pH 5.0 5 to 9   Urine Protein - Dipstick 30  (A) Negative  mg/dl   Urine Glucose Negative Negative   Urine Ketones >=160 (A) Negative mg/dl   Urine Bilirubin Negative Negative   Urine Blood Small (A) Negative   Urine Nitrite Negative Negative   Urine Urobilinogen 0.2  0.2 , 1.0 mg/dl   Urine Leukocyte Esterase Trace (A) Negative   Urine Squamous Epithelial Cells <1 <=2 /HPF   Urine WBC 4 (  H) <=2 /HPF   Urine RBC 4 (H) <=2 /HPF   Urine Bacteria Occasional (A) None Seen /HPF   Urine Mucous Many (A) None /HPF   Urine Hyaline Casts 3 (H) <2 /LPF  Blood Gas, Arterial on current oxygen level   Collection Time: 12/09/16  2:09 AM  Result Value Ref Range   PH ARTERIAL 7.13 (LL) 7.35 - 7.45   PCO2 20 (LL) 35 - 45   PO2 ARTERIAL 165 (H) 80 - 100 mmHg   HCO3 7 (LL) 21 - 28 mEq/L   BASE EXCESS -21 (L) -2 - 2 mmol/L   OXYGEN SATURATION ARTERIAL 99.0 80.0 - 100.0 %   FRACTION OF INSPIRED OXYGEN 30 %   VENTILATION MODE ARTERIAL ACPC    VT SET/TARGET 670.0000    VENTILATION RATE 14.0000    TOTAL RATE ART 20.0000    PEAK INSPIRATORY PRESSURE ARTERIAL 23.0000 cmH20   MAP ARTERIAL 10.0000 CAE   ITIME/%ITIME 1.0000    MINUTE VOLUME ARTERIAL 8.5000    PE/CP/EP 5.0000    INSPIRATORY  PRESSURE 14.0000    PATIENT TEMPERATURE 98.6000 Deg   ALLENS TEST yes    RESPIRATORY CARE NUMBER OF STICKS [facility] 1.0    CALLED RESULTS Eddie P, RCP    DATE TIME CALLED 12/09/2016    SITE LR     PRENATAL STUDIES: Prenatal Labs: Unavailable  Ultrasounds: Unavailable  Chauncey KATHEE Stain MD [facility] OB/GYN PGY-4 12/09/2016 3:38 AM

## 2016-12-09 NOTE — H&P (Addendum)
Ashley Hester is a 26 y.o. female G1 @ 77 0/7 weeks h/o Type I Diabetes well controlled on an Insulin pump with c/o SOB. Pt states SOB started overnight.  First noticed it when she was walking around in [employer].  Pt states when she was sitting on the couch this morning she still felt SOB.  Pt has a remote h/o exercise induced asthma. Currently denies wheezing.  Pt denies significant swelling. Pt did not feel contractions, denies LOF or vaginal bleeding. After reviewing tocometry and frequency was able to feel a tightening.  On my arrival, MAU provider had ordered labs and done a cervical exam.  Per provider, cervix was 1 cm but very thick, not soft and very posterior.  FFN had been ordered.  UA with 80 ketones.  IVF bolus had been ordered and Procardia.  Pt reports she has been drinking a lot of water.  Pt states she was diagnosed with Type I DM 2 years ago Hg A1C 5.7 in 04/2016. States her CBGs have mostly been around less than 100 with the use of her insulin pump.    Pt's ob care has been with [facility] (Dr. Charlotta Newton) and was o/w uncomplicated.  She has also been followed Dr. Sharl Ma, endocrinologist.          OB History    Gravida Para Term Preterm AB Living   1 0 0 0 0 0   SAB TAB Ectopic Multiple Live Births   0 0 0 0           Past Medical History:  Diagnosis Date  . ADHD (attention deficit hyperactivity disorder)   . Asthma    sport induced  . Diabetes mellitus without complication (HCC)   . PONV (postoperative nausea and vomiting)    Hard to wake up  . UTI (lower urinary tract infection)         Past Surgical History:  Procedure Laterality Date  . APPENDECTOMY  [id number]  . LAPAROSCOPIC APPENDECTOMY  10/13/2011   Procedure: APPENDECTOMY LAPAROSCOPIC;  Surgeon: Emelia Loron, MD;  Location: [facility];  Service: General;  Laterality: N/A;  . LAPAROSCOPY  10/13/2011   Procedure: LAPAROSCOPY DIAGNOSTIC;  Surgeon: Emelia Loron, MD;  Location: [facility];   Service: General;  Laterality: N/A;  . wisdon teeth extraction     Family History: family history includes Cancer in her maternal grandmother and paternal uncle; Crohn's disease in her maternal grandmother; Diabetes in her paternal uncle; Hypertension in her mother. Social History:  reports that she has never smoked. She has never used smokeless tobacco. She reports that she does not drink alcohol or use drugs.     Maternal Diabetes: Yes:  Diabetes Type:  Pre-pregnancy, Insulin/Medication controlled Genetic Screening: Quad screen 1:220. Repeat AFP normal.  ROA ultrasound normal Maternal Ultrasounds/Referrals: Normal Fetal Ultrasounds or other Referrals:  None Maternal Substance Abuse:  No Significant Maternal Medications:  Meds include: Other:  Significant Maternal Lab Results:  None Other Comments:  None  ROS Maternal Medical History:  Reason for admission: SOB at rest,  Contractions: Perceived severity is mild.    Fetal activity: Perceived fetal activity is decreased.    Prenatal Complications - Diabetes: type 1. Diabetes is managed by insulin pump.     Blood pressure 136/77, pulse (!) 109, temperature 98.1 F (36.7 C), temperature source Oral, resp. rate (!) 24, SpO2 100 %. Maternal Exam:  Uterine Assessment: Contraction frequency is regular.   Abdomen: Patient reports no abdominal tenderness. Introitus: not evaluated.  Cervix: Cervix evaluated by digital exam.   1/thick, posterior per MAU provider  Fetal Exam Fetal Monitor Review: Variability: moderate (6-25 bpm).   Pattern: accelerations present and no decelerations.    Fetal State Assessment: Category I - tracings are normal.    Physical Exam  Constitutional: She is oriented to person, place, and time. She appears well-developed and well-nourished. No distress.  HENT:  Head: Normocephalic and atraumatic.  Eyes: EOM are normal.  Neck: Normal range of motion.  Cardiovascular: Regular rhythm  and normal heart sounds.   Respiratory: Breath sounds normal. She is in respiratory distress. She has no wheezes.  GI: There is no tenderness.  Musculoskeletal: Normal range of motion. She exhibits edema.  Neurological: She is alert and oriented to person, place, and time.  Skin: Skin is warm and dry.    Prenatal labs: ABO, Rh:  B+ Antibody:  Neg Rubella:  Immune RPR:   Nonreactive HBsAg:   Nonreactive HIV:   Nonreactive GBS:  Not collected due to gestational age.  Assessment/Plan: IUP @ 34 0/7 weeks SOB at rest.  Pt with obvious respiratory distress but O2 sats are normal. Preterm contractions. Type I Diabetes well controlled. Dehydration Reassuring fetal status. Normal BP.  R/o Preeclampsia.  CBC, CMP pending.  Chest Xray ordered to rule out pulmonary edema. CT scan ordered to rule out PE due to significant Tachypnea.   IVF bolus in progress and Procardia given.  F/u Fetal fibronectin.  Suspect contractions will improve s/p rehydration. F/u labs and imaging studies. BMZ. Pt and husband informed of plan and questions were answered. Likely admit for close observation.  Geryl Rankins 12/08/2016, 2:02 PM

## 2016-12-09 NOTE — Progress Notes (Signed)
Wasted 40 ml of propofol in sink.

## 2016-12-09 NOTE — Procedures (Signed)
 [facility] Procedure Note   Arterial catheter Date/Time: 12/09/2016 6:51 AM Performed by: CLAUDENE PFEIFFER Authorized by: LOVENIA REAL B   Consent:    Consent obtained:  Written   Consent given by:  Spouse Indications:    Indications: multiple ABGs   Pre-procedure details:    Skin preparation:  2% Chlorhexidine    Preparation: Patient was prepped and draped in sterile fashion   Sedation:    Sedation type: see MAR. Anesthesia (see MAR for exact dosages):    Anesthesia method: see MAR. Procedure details:    Location:  L radial   Allen's test performed: yes     Allen's test abnormal: no     Needle gauge:  20 G   Placement technique:  Seldinger   Number of attempts:  2 Post-procedure details:    Was procedure successful?:  No   Patient tolerance of procedure:  Tolerated well, no immediate complications Comments:     Arterial Line Placement Complications     Unable to place  Catheter wouldn't advance    PFEIFFER CLAUDENE, RRT 12/09/2016 / 6:51 AM

## 2016-12-09 NOTE — Progress Notes (Signed)
Patients husband at bedside, informed of POC for intubation by Dr. Dion BodyVarnado, Dr. Acey Lavarignan and Dr. Craige CottaSood.  Decided to leave room and remain with family in waiting area.  Updated on patient status throughout time on birthing suites.

## 2016-12-09 NOTE — Progress Notes (Signed)
Patient airway managed by respiratory and anesthesia staff via advanced airway and bag/mask until carelink arrived with portable ventilator approximately 1940

## 2016-12-09 NOTE — Procedures (Signed)
 [facility] Procedure Note   PICC line triple lumen insertion, bedside Date/Time: 12/09/2016 8:26 AM Performed by: RANDINE LEITA NOVAK Authorized by: ESTRELLA RUSH   Written consent obtained?: Yes   Risks and benefits: Risks, benefits and alternatives were discussed   Consent given by:  Spouse (patient intubated and sedated. Husband was at bedside prior to VAT arrival.) Patient states understanding of procedure being performed: Yes   Patient's understanding of procedure matches consent: Yes   Procedure consent matches procedure scheduled: Yes   Relevant documents present and verified: Yes   Test results available and properly labeled: Yes   Site marked: Yes   Imaging studies available: Yes   Required items: Required blood products, implants, devices and special equipment available   Patient identity confirmed:  Anonymous protocol, patient vented/unresponsive, arm band and verbally with patient (verbally identified by primary nurse) Time out: Immediately prior to the procedure a time out was called   Indications:  Vascular access Anesthesia:  Local infiltration Local anesthetic:  Lidocaine  1% without epinephrine  Anesthetic total (ml):  1 Patient sedated: No   Preparation:  Skin prepped with ChloraPrep Skin prep agent dried: Skin prep agent completely dried prior to procedure   Sterile barriers: All five maximal sterile barriers used - gloves, gown, cap, mask and large sterile sheet   Hand hygiene: Hand hygiene performed prior to central venous catheter   Location:  Right basilic Site selection rationale:  Easily accessible Patient position: semi fowlers. Catheter type:  Triple lumen Catheter size:  5 Fr Lot #:  [id number] Cut Length (cm):  37 Extremity Circumference (cm):  28 Pre-procedure: Landmarks identified   Ultrasound guidance: Yes   Number of attempts:  1 Successful placement: Yes   Post-procedure:  Dressing applied Assessment:  Blood return  through all ports and placement verified by ECG Patient tolerance of procedure:  Patient tolerated the procedure well with no immediate complications  Educational booklet left at bedside.          LEITA NOVAK RANDINE, RN 12/09/2016 / 8:26 AM

## 2016-12-10 LAB — CULTURE, OB URINE

## 2016-12-10 NOTE — Nursing Note (Addendum)
 Pt had a glucose sensor and transmitter (Continuous Glucose Monitor) in posterior left upper extremity. Given back to patient's mother

## 2016-12-10 NOTE — Nursing Note (Signed)
 Pt arrived intubated with 3 PIV/foley catheter and restrained.  Care plans and education initiated.  Pt placed on monitor in 260

## 2016-12-14 LAB — CULTURE, BLOOD (ROUTINE X 2)
CULTURE: NO GROWTH
Culture: NO GROWTH
SPECIAL REQUESTS: ADEQUATE
SPECIAL REQUESTS: ADEQUATE

## 2016-12-20 NOTE — Care Plan (Signed)
 Called by RN to assess bleeding in PACU. EBL 949. Fundus firm. Moderate amount of blood on chux and upon fundal check. Continue to monitor. No additional intervention at this time.   Garnette NOVAK. Sebastian, MD PGY-3 OB/GYN

## 2016-12-20 NOTE — Progress Notes (Signed)
-------------------------------------------------------------------------------   Attestation signed by Eleanor LITTIE Everts, MD at 12/20/16 [phone] I reviewed the patient's status and agree with the plan of care as documented by the resident.  Eleanor Everts, MD [facility]   -------------------------------------------------------------------------------  Late entry due to Epic downtime.   AROM with blood tinged fluid at 0205.   Patient with recurrent late decelerations. IUPC and FSE placed at 0255. Intrauterine resusciation measures undertaken with some improvement.   SVE unchanged at 4/75/-3.   Given NRFHT remote from delivery, c-section called.   C/S Discussion  Indication: non-reassuring FHT remote from delivery  Discussed c/s including risk of bleeding possibly requiring transfusion, infection and damage to surrounding structures including bowel, bladder, ureters and baby.  All questions answered and informed consent obtained, consents previously signed.  Discussed with attending physician Dr. Everts; OR and Anesthesia made aware.  Kaitlyn C. McCune, MD Obstetrics and Gynecology, PGY-3 12/20/2016 3:44 AM

## 2017-01-01 NOTE — Discharge Summary (Signed)
Physician Discharge Summary  Patient ID: Ashley Hester MRN: [id number] DOB/AGE: 25/01/1991 25 y.o.  Admit date: 12/08/2016 Discharge date: 01/01/2017  Admission Diagnoses: Preeclampsia Discharge Diagnoses:  Active Problems:   Preeclampsia, third trimester DKA  Discharged Condition: serious  Hospital Course: Pt was admitted.  Pt was contracting, FFN neg.  Cervix was 1 cm. Pt was intubated within 1 hour of admission.  Managed [facility] Pulmonary[facility]Critical care team, [facility]  [facility]   Continuous fetal monitoring was overall reassuring. Strip reviewed remotely with MFM. A few late decelerations were noted.  [facility]  m[facility] critical [facility] and prolonged fetal monitoring.  Consults: pulmonary/intensive care and Maternal Fetal Medicine  Significant Diagnostic Studies: labs: Large anion gap, neg tox screen.  PCR 1.47, Slightly elevated LFTs  Treatments: IV hydration and steroids: Betamethasone, Intubation  Discharge Exam: Blood pressure (!) 116/55, pulse (!) 118, temperature 98.6 F (37 C), resp. rate (!) 23, height 5\' 4"  (1.626 m), weight 71.7 kg (158 lb), SpO2 99 %.  See Dr. Lawana Chamberszan's note.  Dis[facility]: [facility]   Allergies as of 12/09/2016   No Known Allergies     Medication List    STOP taking these medications   famotidine 10 MG tablet Commonly known as:  PEPCID   NOVOLOG FLEXPEN 100 UNIT/ML FlexPen Generic drug:  insulin aspart   prenatal multivitamin Tabs tablet        Signed: Geryl RankinsVARNADO, Ashley Hester 01/01/2017, 7:20 AM

## 2017-04-19 ENCOUNTER — Encounter (HOSPITAL_COMMUNITY): Payer: Self-pay

## 2018-06-12 IMAGING — CT CT ANGIO CHEST
1 of 3 series · 18 of 33 positions shown · IV contrast (OMNIPAQUE)
Comparison: No priors.

CLINICAL DATA: 25-year-old female with sudden onset of shortness of
breath and lower left anterior chest pain.

EXAM:
CT ANGIOGRAPHY CHEST WITH CONTRAST
TECHNIQUE: Multidetector CT imaging of the chest was performed using the
standard protocol during bolus administration of intravenous
contrast. Multiplanar CT image reconstructions and MIPs were
obtained to evaluate the vascular anatomy.
CONTRAST:  100 mL of Isovue 370.

[Series 5: (person_name) thins · axial · 0.70mm/px · z∈[-241,+35]mm · 18 of 453 slices shown]
[im 29/453  lung]
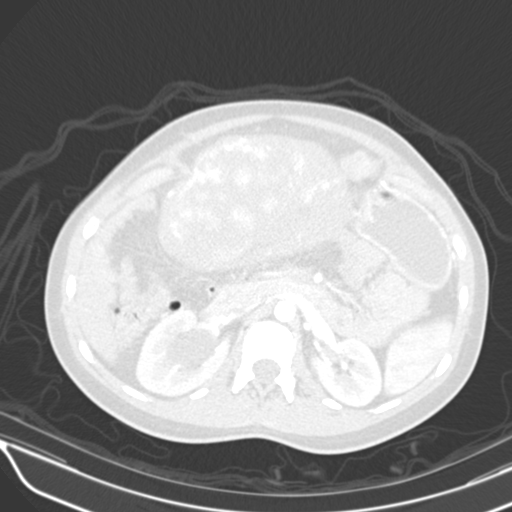
[im 57/453  mediastinal]
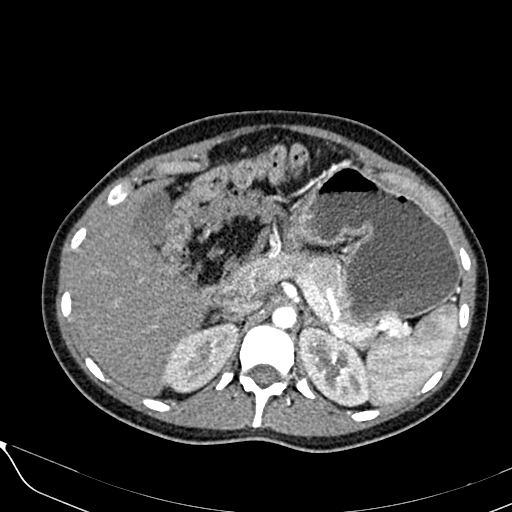
[im 85/453  lung]
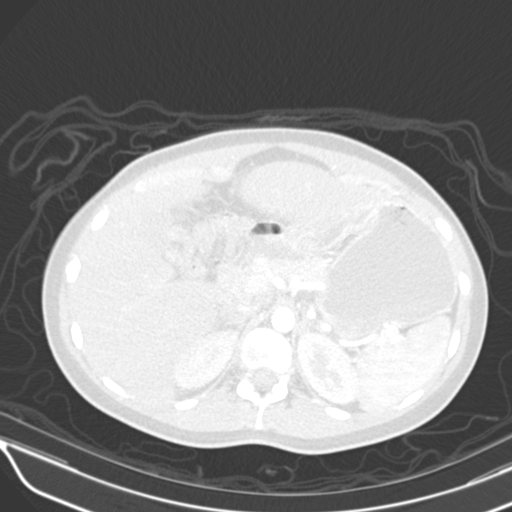
[im 114/453  mediastinal]
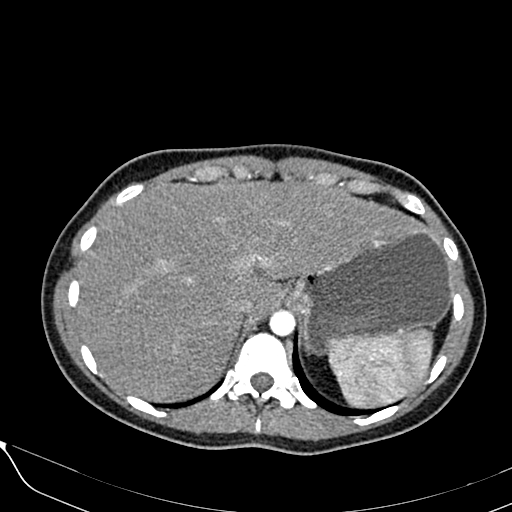
[im 142/453  lung]
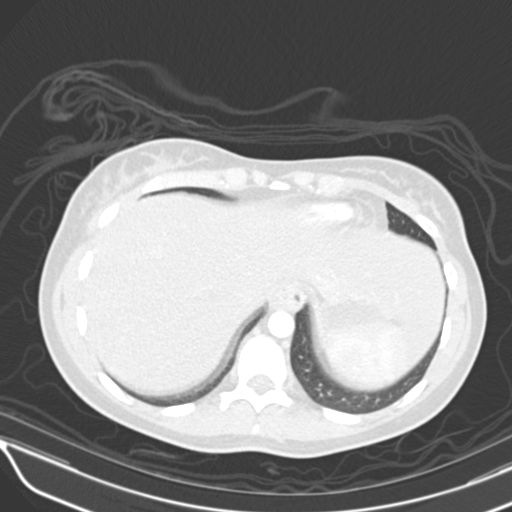
[im 151/453  mediastinal]
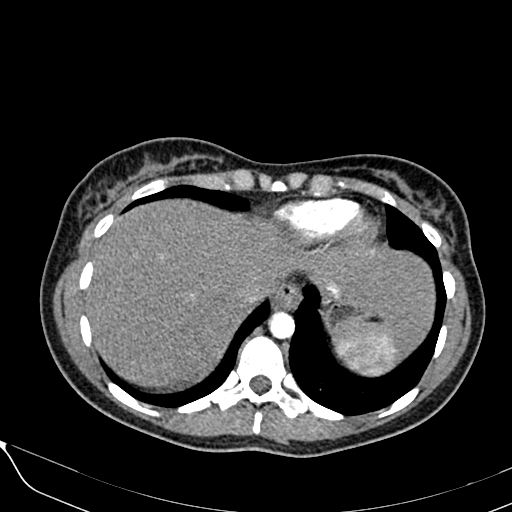
[im 170/453  lung]
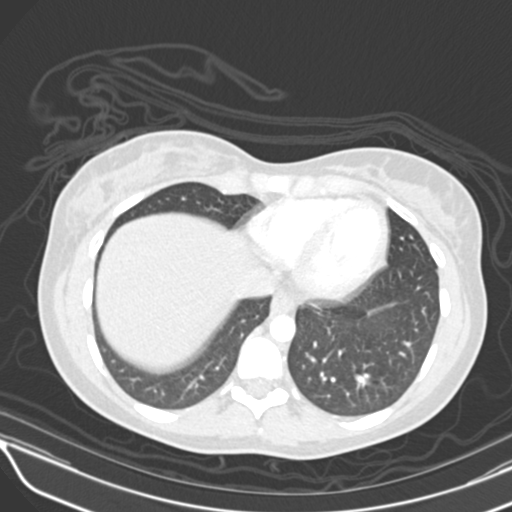
[im 198/453  mediastinal]
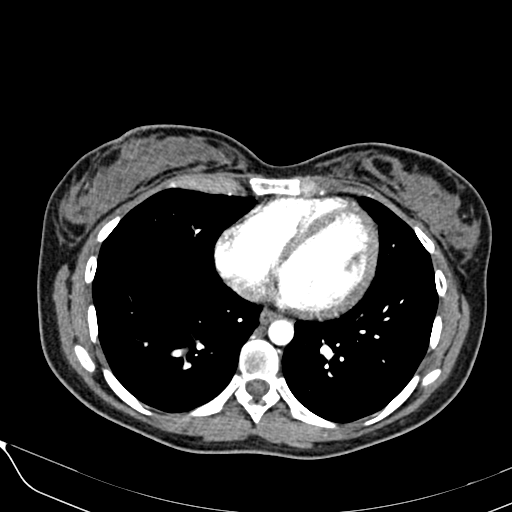
[im 213/453  lung]
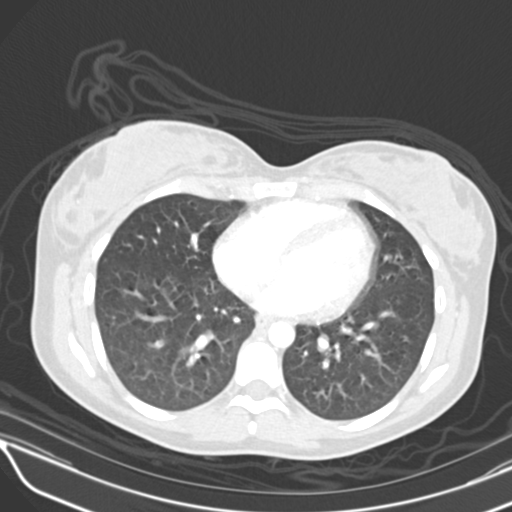
[im 227/453  mediastinal]
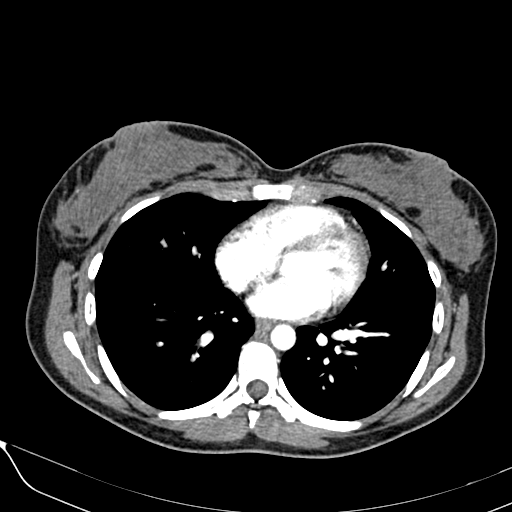
[im 255/453  lung]
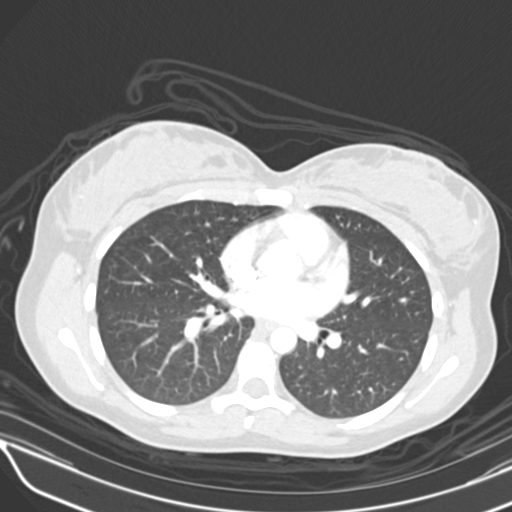
[im 283/453  mediastinal]
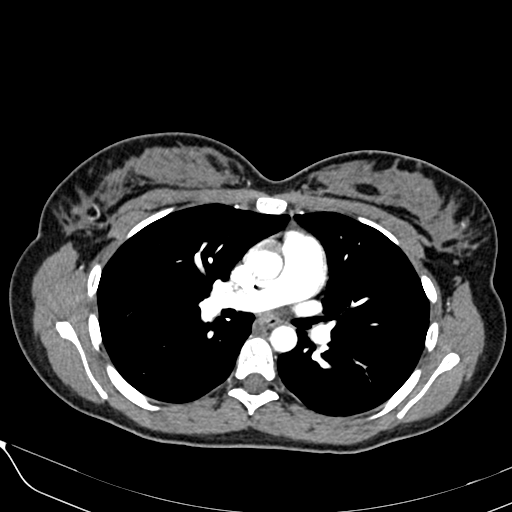
[im 302/453  lung]
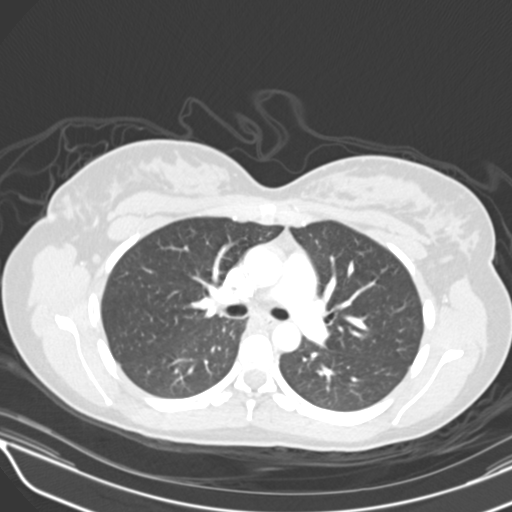
[im 311/453  mediastinal]
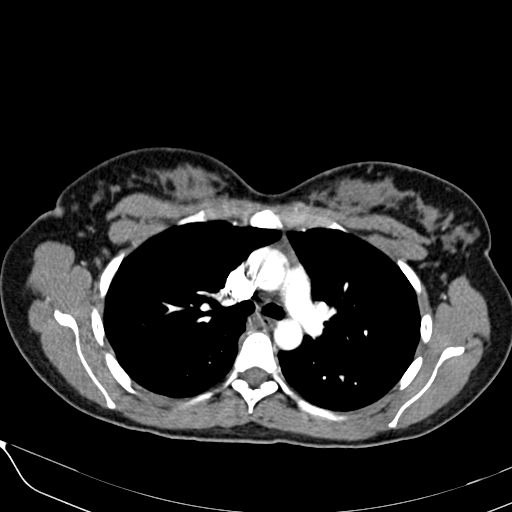
[im 340/453  lung]
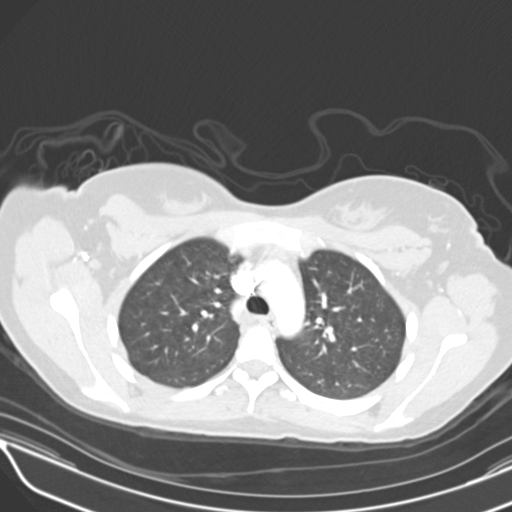
[im 368/453  mediastinal]
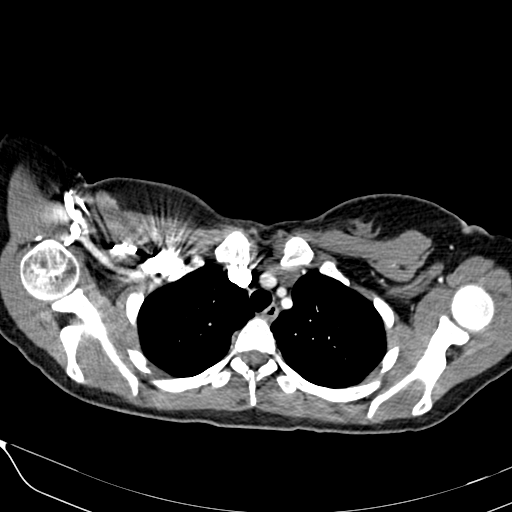
[im 396/453  lung]
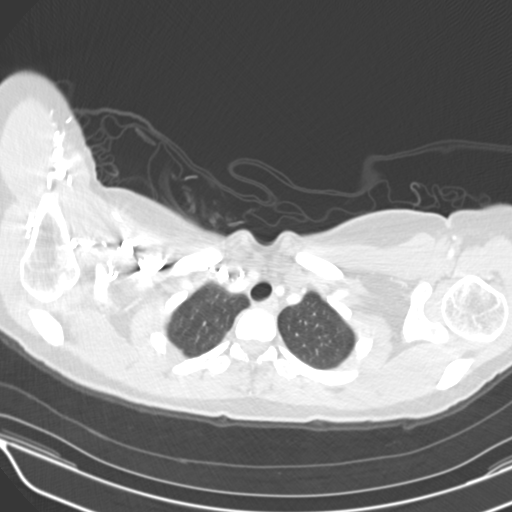
[im 424/453  mediastinal]
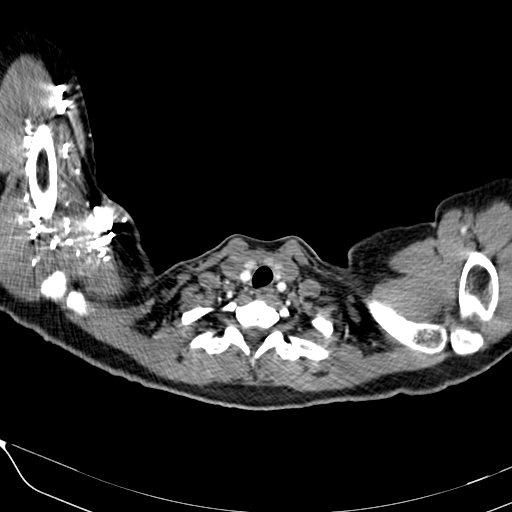

[18 of 33 positions shown; findings below may reference images not displayed]

FINDINGS: Comment: Study is slightly limited by respiratory motion which
limits assessment for predominantly distal segmental and
subsegmental sized pulmonary arteries.

Cardiovascular: There are no filling defects within the central,
lobar or proximal segmental sized pulmonary arterial tree to suggest
clinically relevant pulmonary embolism. More distal segmental and
subsegmental sized emboli cannot be entirely excluded secondary to
respiratory motion. Heart size is normal. There is no significant
pericardial fluid, thickening or pericardial calcification. No
significant atherosclerotic disease noted in the thoracic aorta. No
coronary artery calcifications.

Mediastinum/Nodes: No pathologically enlarged mediastinal or hilar
lymph nodes. Esophagus is unremarkable in appearance. No axillary
lymphadenopathy.

Lungs/Pleura: No acute consolidative airspace disease. No pleural
effusions. No suspicious appearing pulmonary nodules or masses.

Upper Abdomen: Fundus of the gravid uterus is incompletely
visualized. Right-sided hydroureteronephrosis also incompletely
visualized, likely secondary to compression of the distal right
ureter from the gravid uterus.

Musculoskeletal: There are no aggressive appearing lytic or blastic
lesions noted in the visualized portions of the skeleton.

Review of the MIP images confirms the above findings.
IMPRESSION: 1. Despite the limitations of today's examination there is no
evidence of central, lobar or proximal segmental sized pulmonary
embolism.
2. No acute findings are noted in the thorax to account for the
patient's symptoms.
3. Right-sided hydroureteronephrosis incompletely visualized, likely
secondary to distal right ureteral compression from the gravid
uterus.

## 2018-07-19 DIAGNOSIS — J111 Influenza due to unidentified influenza virus with other respiratory manifestations: Secondary | ICD-10-CM | POA: Diagnosis not present

## 2018-07-19 DIAGNOSIS — R52 Pain, unspecified: Secondary | ICD-10-CM | POA: Diagnosis not present

## 2018-10-12 DIAGNOSIS — Z794 Long term (current) use of insulin: Secondary | ICD-10-CM | POA: Diagnosis not present

## 2018-10-12 DIAGNOSIS — E109 Type 1 diabetes mellitus without complications: Secondary | ICD-10-CM | POA: Diagnosis not present

## 2019-12-12 ENCOUNTER — Ambulatory Visit: Payer: 59 | Attending: Obstetrics and Gynecology

## 2019-12-12 ENCOUNTER — Ambulatory Visit (HOSPITAL_BASED_OUTPATIENT_CLINIC_OR_DEPARTMENT_OTHER): Payer: 59 | Admitting: *Deleted

## 2019-12-12 ENCOUNTER — Other Ambulatory Visit: Payer: Self-pay

## 2019-12-12 DIAGNOSIS — E109 Type 1 diabetes mellitus without complications: Secondary | ICD-10-CM

## 2019-12-12 DIAGNOSIS — Z98891 History of uterine scar from previous surgery: Secondary | ICD-10-CM | POA: Diagnosis not present

## 2019-12-12 DIAGNOSIS — Z8759 Personal history of other complications of pregnancy, childbirth and the puerperium: Secondary | ICD-10-CM | POA: Diagnosis not present

## 2019-12-12 DIAGNOSIS — Z3169 Encounter for other general counseling and advice on procreation: Secondary | ICD-10-CM | POA: Insufficient documentation

## 2019-12-12 DIAGNOSIS — Z8639 Personal history of other endocrine, nutritional and metabolic disease: Secondary | ICD-10-CM | POA: Insufficient documentation

## 2019-12-12 DIAGNOSIS — O24019 Pre-existing diabetes mellitus, type 1, in pregnancy, unspecified trimester: Secondary | ICD-10-CM

## 2019-12-12 NOTE — Progress Notes (Signed)
Pt in for preconception counseling. BP 105/81, pulse 71. Pt to meet with Dr. Parke Poisson.

## 2019-12-22 NOTE — Consult Note (Signed)
MFM Consult Note  Ashley Hester is a 29 year old gravida 1 para 0-1-0-1 who was seen for preconception consultation to discuss the risks and management of her future pregnancy due to type 1 diabetes.  The patient reports that she was diagnosed with type 1 diabetes when she was about 77 to 29 years old in February 2017.  She has been told by her endocrinologist that her diabetes is due to a lack of insulin production, therefore making her a type I diabetic.  She reports that during her last pregnancy, she restricted herself to a low carbohydrate diet and as a result, her blood sugars became normalized and she discontinued insulin.  Due to probable starvation ketosis, she developed ketoacidosis and required intubation and an ICU admission.  She then developed preeclampsia and required delivery at 35+ weeks.  She reports that her hemoglobin A1c drawn a while ago was about 6%.  She is seeing Dr. Talmage Coin from [facility] for the management of her diabetes.  She currently has a Dexcom continuous glucose monitor in place and is being treated with an insulin pump.    The patient and her husband stated that they are considering conceiving another pregnancy within the next year.  She was seen in consultation today to discuss the management and the potential risks of her future pregnancy.  Her past medical history includes type 1 diabetes and ADHD.  Past OB history includes an emergency C-section at 35+ weeks due to recurrent decelerations/nonreassuring fetal heart rate tracing.  Past surgical history includes C-section x1 and an appendectomy in 2013.  Her current medications include a prenatal vitamin, Vyvanse for treatment of ADHD and Humalog insulin in her insulin pump.   The implications and management of diabetes in pregnancy was discussed in detail with the patient and her husband.  As they are planning to conceive a pregnancy in the near future, she was advised that she should try to blood  get her glucose levels as close to the normal range as possible and to try to achieve a hemoglobin A1c value of 6% or less, as this would provide her with the most optimal obstetrical outcomes.   The patient may be referred to the [facility] during the preconception period to help guide her regarding the appropriate foods to eat to normalize her blood glucose levels and to lower her hemoglobin A1c levels.  The [facility] will see patients once they receive a physician referral.  She was advised to continue taking a prenatal vitamin and extra folic acid during the preconception period to decrease her risk of having a fetus with neural tube defects.  She was advised that once she is pregnant, our goals for her fingerstick/blood glucose values are fasting values of 90-95 or less and two-hour postprandials of 120 or less.  Should her glucose values be above these values, the insulin pump dosage rate may have to be adjusted to help her achieve better glycemic control.  The patient should work closely with her endocrinologist throughout her future pregnancy to help her obtain the most optimal glycemic control.  Once conceives, she should have a hemoglobin A1c drawn early in her pregnancy.  She should also have a baseline 24-hour urine collected to assess for total protein.  Her BUN and creatinine levels should also be assessed to determine her renal function.  She should also have an EKG performed to assess for any cardiac conditions.  As women with diabetes are at increased  risk for developing preeclampsia, she should start a daily baby aspirin (81 mg daily) at around 12 weeks of her future pregnancy for preeclampsia prophylaxis.  The patient was advised that due to diabetes in pregnancy, we will continue to follow her with serial ultrasounds.  She should have a first trimester ultrasound to establish her due date.  She should be offered a cell free DNA test for  fetal aneuploidy screening.  An alpha-fetoprotein test may be ordered at around 16 weeks to screen for spina bifida.  A fetal anatomy scan should be scheduled for her in our office at around 19 weeks.  She should be referred for a fetal echocardiogram at around 21 to 22 weeks.  She should then continue to be followed with serial growth ultrasounds every 4 to 5 weeks. Weekly fetal testing should be started at around 32 weeks.  The patient was advised that should the fetal growth appear normal and she maintains normal glucose control, that delivery at around 39 weeks is usually recommended.  Delivery at 37 weeks may be considered should her glycemic control be poor.    As there is very little data regarding Vyvanse treatment in pregnancy, she should continue this medication for treatment of ADHD if the benefits outweigh the risks.  The patient and her husband were reassured that with proper management and close follow-up, I anticipate that she will have a successful and hopefully uneventful pregnancy in the future.  They were reassured that many women with diabetes have had successful pregnancy outcomes and that diabetes should not preclude her from conceiving another pregnancy should she desire.    We look forward to following her closely with you throughout her future pregnancy.    At the end of the consultation, the patient and her husband stated that all their questions had been answered to their complete satisfaction.     Thank you for referring this very nice patient for Maternal-Fetal Medicine preconception consultation.  A total of 55 minutes was spent working on this consultation.    This consultation was copied from an original Microsoft Word document.  Recommendations: Try to achieve a hemoglobin A1c value of 6% or less Referral to the [facility] for counseling Baseline 24-hour urine, BUN creatinine levels to assess kidney function, EKG The patient may  continue her insulin pump during pregnancy Work closely with endocrinologist for glucose management Serial ultrasounds including detailed anatomy scan, fetal echocardiogram, and growth ultrasounds Weekly fetal testing to start at 32 weeks Daily baby aspirin for preeclampsia prophylaxis Delivery at between 37 to 39 weeks

## 2020-10-30 DIAGNOSIS — F900 Attention-deficit hyperactivity disorder, predominantly inattentive type: Secondary | ICD-10-CM | POA: Insufficient documentation

## 2021-05-08 ENCOUNTER — Encounter (HOSPITAL_COMMUNITY): Payer: Self-pay | Admitting: Obstetrics and Gynecology

## 2021-05-08 ENCOUNTER — Inpatient Hospital Stay (HOSPITAL_COMMUNITY): Payer: 59

## 2021-05-08 ENCOUNTER — Other Ambulatory Visit: Payer: Self-pay

## 2021-05-08 ENCOUNTER — Inpatient Hospital Stay (HOSPITAL_COMMUNITY)
Admission: AD | Admit: 2021-05-08 | Discharge: 2021-05-08 | Disposition: A | Discharge status: Home / Self Care | Payer: 59 | Attending: Obstetrics and Gynecology | Admitting: Obstetrics and Gynecology

## 2021-05-08 DIAGNOSIS — O3680X0 Pregnancy with inconclusive fetal viability, not applicable or unspecified: Secondary | ICD-10-CM | POA: Insufficient documentation

## 2021-05-08 DIAGNOSIS — O209 Hemorrhage in early pregnancy, unspecified: Secondary | ICD-10-CM | POA: Diagnosis not present

## 2021-05-08 DIAGNOSIS — Z3A01 Less than 8 weeks gestation of pregnancy: Secondary | ICD-10-CM | POA: Diagnosis not present

## 2021-05-08 LAB — URINALYSIS, ROUTINE W REFLEX MICROSCOPIC
Bilirubin Urine: NEGATIVE
Glucose, UA: NEGATIVE mg/dL
Ketones, ur: NEGATIVE mg/dL
Leukocytes,Ua: NEGATIVE
Nitrite: NEGATIVE
Protein, ur: NEGATIVE mg/dL
Specific Gravity, Urine: 1.003 — ABNORMAL LOW (ref 1.005–1.030)
pH: 6 (ref 5.0–8.0)

## 2021-05-08 LAB — CBC
HCT: 36.3 % (ref 36.0–46.0)
Hemoglobin: 12.6 g/dL (ref 12.0–15.0)
MCH: 30.3 pg (ref 26.0–34.0)
MCHC: 34.7 g/dL (ref 30.0–36.0)
MCV: 87.3 fL (ref 80.0–100.0)
Platelets: 273 10*3/uL (ref 150–400)
RBC: 4.16 MIL/uL (ref 3.87–5.11)
RDW: 11.9 % (ref 11.5–15.5)
WBC: 9.7 10*3/uL (ref 4.0–10.5)
nRBC: 0 % (ref 0.0–0.2)

## 2021-05-08 LAB — WET PREP, GENITAL
Clue Cells Wet Prep HPF POC: NONE SEEN
Sperm: NONE SEEN
Trich, Wet Prep: NONE SEEN
WBC, Wet Prep HPF POC: >=10 — AB (ref <10)
Yeast Wet Prep HPF POC: NONE SEEN

## 2021-05-08 LAB — HCG, QUANTITATIVE, PREGNANCY: hCG, Beta Chain, Quant, S: 336 m[IU]/mL — ABNORMAL HIGH (ref <5)

## 2021-05-08 LAB — POCT PREGNANCY, URINE: Preg Test, Ur: POSITIVE — AB

## 2021-05-08 NOTE — MAU Provider Note (Signed)
Chief Complaint: Vaginal Bleeding   Event Date/Time   First Provider Initiated Contact with Patient 05/08/21 1126      SUBJECTIVE HPI: Ashley Hester is a 30 y.o. G2P0101 at [redacted]w[redacted]d by sure LMP with positive home UPT 04/17/21 who presents to maternity admissions reporting onset of heavy bleeding with clots at home this morning.  There is associated menstrual like cramping.  She denies vaginal itching/burning, urinary symptoms, h/a, dizziness, n/v, or fever/chills.      HPI  Past Medical History:  Diagnosis Date   ADHD (attention deficit hyperactivity disorder)    Asthma    sport induced   Diabetes mellitus without complication (HCC)    Dyspnea    PONV (postoperative nausea and vomiting)    Hard to wake up   UTI (lower urinary tract infection)    Past Surgical History:  Procedure Laterality Date   APPENDECTOMY  [id number]   CESAREAN SECTION     LAPAROSCOPIC APPENDECTOMY  10/13/2011   Procedure: APPENDECTOMY LAPAROSCOPIC;  Surgeon: Emelia Loron, MD;  Location: [facility];  Service: General;  Laterality: N/A;   LAPAROSCOPY  10/13/2011   Procedure: LAPAROSCOPY DIAGNOSTIC;  Surgeon: Emelia Loron, MD;  Location: [facility];  Service: General;  Laterality: N/A;   wisdon teeth extraction     Social History   Socioeconomic History   Marital status: Single    Spouse name: Not on file   Number of children: Not on file   Years of education: Not on file   Highest education level: Not on file  Occupational History   Not on file  Tobacco Use   Smoking status: Never   Smokeless tobacco: Never  Vaping Use   Vaping Use: Never used  Substance and Sexual Activity   Alcohol use: No   Drug use: No   Sexual activity: Never  Other Topics Concern   Not on file  Social History Narrative   Not on file   Social Determinants of Health   Financial Resource Strain: Not on file  Food Insecurity: Not on file  Transportation Needs: Not on file  Physical Activity: Not on file  Stress: Not  on file  Social Connections: Not on file  Intimate Partner Violence: Not on file   Current Facility-Administered Medications on File Prior to Encounter  Medication Dose Route Frequency Provider Last Rate Last Admin   propofol (DIPRIVAN) 10 mg/mL bolus/IV push    Anesthesia Intra-op Yolonda Kida, CRNA   15 mg at 12/08/16 2126   succinylcholine (ANECTINE) injection    Anesthesia Intra-op Yolonda Kida, CRNA   100 mg at 12/08/16 1908   Current Outpatient Medications on File Prior to Encounter  Medication Sig Dispense Refill   insulin lispro (HUMALOG) 100 UNIT/ML injection Inject into the skin 3 (three) times daily before meals.     Fluticasone Propionate (FLONASE NA) Place into the nose. (Patient not taking: Reported on 12/12/2019)     levonorgestrel (MIRENA) 20 MCG/24HR IUD 1 each by Intrauterine route once.     Lisdexamfetamine Dimesylate (VYVANSE PO) Take by mouth.     No Known Allergies  ROS:  Review of Systems  Constitutional:  Negative for chills, fatigue and fever.  Respiratory:  Negative for shortness of breath.   Cardiovascular:  Negative for chest pain.  Gastrointestinal:  Positive for abdominal pain. Negative for nausea and vomiting.  Genitourinary:  Positive for vaginal bleeding. Negative for difficulty urinating, dysuria, flank pain, pelvic pain, vaginal discharge and vaginal pain.  Neurological:  Negative  for dizziness and headaches.  Psychiatric/Behavioral: Negative.      I have reviewed patient's Past Medical Hx, Surgical Hx, Family Hx, Social Hx, medications and allergies.   Physical Exam  Patient Vitals for the past 24 hrs:  BP Temp Pulse Resp SpO2 Height Weight  05/08/21 1043 117/71 97.6 F (36.4 C) 83 20 100 % -- --  05/08/21 1038 -- -- -- -- -- 5\' 4"  (1.626 m) 71.3 kg   Constitutional: Well-developed, well-nourished female in no acute distress.  Cardiovascular: normal rate Respiratory: normal effort GI: Abd soft, non-tender. Pos BS x 4 MS: Extremities  nontender, no edema, normal ROM Neurologic: Alert and oriented x 4.  GU: Neg CVAT.  PELVIC EXAM: Cervix pink, visually closed, without lesion, moderate amount of bleeding requiring 2 fox swabs to visualize cervix, vaginal walls and external genitalia normal   LAB RESULTS Results for orders placed or performed during the hospital encounter of 05/08/21 (from the past 24 hour(s))  Pregnancy, urine POC     Status: Abnormal   Collection Time: 05/08/21 10:53 AM  Result Value Ref Range   Preg Test, Ur POSITIVE (A) NEGATIVE  Urinalysis, Routine w reflex microscopic Urine, Clean Catch     Status: Abnormal   Collection Time: 05/08/21 11:14 AM  Result Value Ref Range   Color, Urine YELLOW YELLOW   APPearance CLEAR CLEAR   Specific Gravity, Urine 1.003 (L) 1.005 - 1.030   pH 6.0 5.0 - 8.0   Glucose, UA NEGATIVE NEGATIVE mg/dL   Hgb urine dipstick LARGE (A) NEGATIVE   Bilirubin Urine NEGATIVE NEGATIVE   Ketones, ur NEGATIVE NEGATIVE mg/dL   Protein, ur NEGATIVE NEGATIVE mg/dL   Nitrite NEGATIVE NEGATIVE   Leukocytes,Ua NEGATIVE NEGATIVE   RBC / HPF 21-50 0 - 5 RBC/hpf   WBC, UA 0-5 0 - 5 WBC/hpf   Bacteria, UA RARE (A) NONE SEEN   Squamous Epithelial / LPF 0-5 0 - 5   Mucus PRESENT   Wet prep, genital     Status: Abnormal   Collection Time: 05/08/21 11:36 AM   Specimen: PATH Cytology Cervicovaginal Ancillary Only  Result Value Ref Range   Yeast Wet Prep HPF POC NONE SEEN NONE SEEN   Trich, Wet Prep NONE SEEN NONE SEEN   Clue Cells Wet Prep HPF POC NONE SEEN NONE SEEN   WBC, Wet Prep HPF POC >=10 (A) <10   Sperm NONE SEEN   CBC     Status: None   Collection Time: 05/08/21 11:38 AM  Result Value Ref Range   WBC 9.7 4.0 - 10.5 K/uL   RBC 4.16 3.87 - 5.11 MIL/uL   Hemoglobin 12.6 12.0 - 15.0 g/dL   HCT 05/10/21 62.9 - 47.6 %   MCV 87.3 80.0 - 100.0 fL   MCH 30.3 26.0 - 34.0 pg   MCHC 34.7 30.0 - 36.0 g/dL   RDW 54.6 50.3 - 54.6 %   Platelets 273 150 - 400 K/uL   nRBC 0.0 0.0 - 0.2 %   hCG, quantitative, pregnancy     Status: Abnormal   Collection Time: 05/08/21 11:38 AM  Result Value Ref Range   hCG, Beta Chain, Quant, S 336 (H) <5 mIU/mL       IMAGING 05/10/21 OB LESS THAN 14 WEEKS WITH OB TRANSVAGINAL  Result Date: 05/08/2021 CLINICAL DATA:  Bleeding.  Passing clots. EXAM: OBSTETRIC <14 WK 05/10/2021 AND TRANSVAGINAL OB US TECHNIQUE: Both transabdominal and transvaginal ultrasound examinations were performed for complete evaluation of the  gestation as well as the maternal uterus, adnexal regions, and pelvic cul-de-sac. Transvaginal technique was performed to assess early pregnancy. COMPARISON:  None. FINDINGS: Intrauterine gestational sac: None Subchorionic hemorrhage:  None visualized. Maternal uterus/adnexae: Corpus luteum cyst is seen on the right. The left ovary is normal. The endometrium is mildly thickened and heterogeneous. IMPRESSION: 1. No IUP identified. In the setting of a positive pregnancy test, the lack of an IUP could represent early pregnancy, recent miscarriage, or ectopic pregnancy. Recommend close clinical and imaging follow-up. Electronically Signed   By: Gerome Sam III M.D.   On: 05/08/2021 12:53    MAU Management/MDM: Orders Placed This Encounter  Procedures   Wet prep, genital   US OB LESS THAN 14 WEEKS WITH OB TRANSVAGINAL   Urinalysis, Routine w reflex microscopic Urine, Clean Catch   CBC   hCG, quantitative, pregnancy   Pregnancy, urine POC   Discharge patient    No orders of the defined types were placed in this encounter.   Findings today could represent a normal early pregnancy, spontaneous abortion or ectopic pregnancy which can be life-threatening.  Ectopic precautions were given to the patient with plan to return in 48 hours for repeat quant hcg to evaluate pregnancy development.  Pt has OB appointment with [facility] on Monday, 05/12/21 and would prefer to follow up there.  Given lower suspicion for ectopic with sure LMP, amount  of vaginal bleeding, and low hcg, it is reasonable to follow up outpatient.  Ectopic precautions/reasons to return to MAU were given and pt states understanding.     ASSESSMENT 1. Pregnancy of unknown anatomic location   2. Vaginal bleeding in pregnancy, first trimester   3. [redacted] weeks gestation of pregnancy     PLAN Discharge home Allergies as of 05/08/2021   No Known Allergies      Medication List     STOP taking these medications    FLONASE NA   insulin lispro 100 UNIT/ML injection Commonly known as: HUMALOG   levonorgestrel 20 MCG/24HR IUD Commonly known as: MIRENA   VYVANSE PO        Follow-up Information     [facility] Follow up.   Why: On Monday, 05/12/21 as scheduled. Contact information: [address] [place] [address] [phone]         [facility] Follow up.   Specialty: Obstetrics and Gynecology Why: As needed for emergencies Contact information: [address] [id number] [place]he[place] [address] [phone]                Sharen Counter Certified Nurse-Midwife 05/08/2021  1:24 PM

## 2021-05-08 NOTE — MAU Note (Signed)
Presents stating she thinks she had a miscarriage this morning.  Reports earlier this morning VB was heavy with clots, now moderate flow.  LMP 03/20/2021.

## 2021-05-09 LAB — GC/CHLAMYDIA PROBE AMP (~~LOC~~) NOT AT ARMC
Chlamydia: NEGATIVE
Comment: NEGATIVE
Comment: NORMAL
Neisseria Gonorrhea: NEGATIVE

## 2021-05-10 ENCOUNTER — Other Ambulatory Visit (HOSPITAL_COMMUNITY): Payer: 59

## 2022-01-13 ENCOUNTER — Ambulatory Visit (INDEPENDENT_AMBULATORY_CARE_PROVIDER_SITE_OTHER): Payer: 59

## 2022-01-13 ENCOUNTER — Encounter: Payer: Self-pay | Admitting: Orthopaedic Surgery

## 2022-01-13 ENCOUNTER — Ambulatory Visit: Payer: 59 | Admitting: Orthopaedic Surgery

## 2022-01-13 DIAGNOSIS — M25572 Pain in left ankle and joints of left foot: Secondary | ICD-10-CM

## 2022-01-13 NOTE — Progress Notes (Signed)
Office Visit Note   Patient: Ashley Hester           Date of Birth: May 22, 1991           MRN: [id number] Visit Date: 01/13/2022              Requested by: Maurice Small, MD [address] [place],  [place] [address] PCP: Maurice Small, MD   Assessment & Plan: Visit Diagnoses:  1. Pain in left ankle and joints of left foot     Plan: Impression is grade 3 left ankle sprain.  We will immobilize in a cam boot for 2 to 3 weeks and then wean into an ASO as tolerated.  Recommend Tylenol rest and ice and compression.  Cannot take NSAIDs due to pregnancy.  If she decides in 6 weeks that she would like to try some outpatient PT she will let me know.  Follow-Up Instructions: No follow-ups on file.   Orders:  Orders Placed This Encounter  Procedures   XR Ankle Complete Left   No orders of the defined types were placed in this encounter.     Procedures: No procedures performed   Clinical Data: No additional findings.   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  Jumped and came down on another player's foot and turned her ankle.  Has lateral pain and swelling.  She is able to weight-bear but there is pain.  Currently [redacted] weeks pregnant. Review of Systems  Constitutional: Negative.   HENT: Negative.    Eyes: Negative.   Respiratory: Negative.    Cardiovascular: Negative.   Endocrine: Negative.   Musculoskeletal: Negative.   Neurological: Negative.   Hematological: Negative.   Psychiatric/Behavioral: Negative.    All other systems reviewed and are negative.    Objective: Vital Signs: LMP 03/20/2021   Physical Exam Vitals and nursing note reviewed.  Constitutional:      Appearance: She is well-developed.  HENT:     Head: Atraumatic.     Nose: Nose  normal.  Eyes:     Extraocular Movements: Extraocular movements intact.  Cardiovascular:     Pulses: Normal pulses.  Pulmonary:     Effort: Pulmonary effort is normal.  Abdominal:     Palpations: Abdomen is soft.  Musculoskeletal:     Cervical back: Neck supple.  Skin:    General: Skin is warm.     Capillary Refill: Capillary refill takes less than 2 seconds.  Neurological:     Mental Status: She is alert. Mental status is at baseline.  Psychiatric:        Behavior: Behavior normal.        Thought Content: Thought content normal.        Judgment: Judgment normal.     Ortho Exam Examination left ankle shows focal swelling laterally.  She has tenderness to the lateral ankle ligaments.  Anterior drawer test is negative.  Motor or sensory function intact. Specialty Comments:  No specialty comments available.  Imaging: XR Ankle Complete Left  Result Date: 01/13/2022 No acute or structural abnormalities    PMFS History: Patient Active Problem List   Diagnosis Date Noted   Preeclampsia, third trimester 12/08/2016   Past Medical History:  Diagnosis Date   ADHD (attention deficit  hyperactivity disorder)    Asthma    sport induced   Diabetes mellitus without complication (HCC)    Dyspnea    PONV (postoperative nausea and vomiting)    Hard to wake up   UTI (lower urinary tract infection)     Family History  Problem Relation Age of Onset   Cancer Mother        Thyroid   Hypertension Mother    Cancer Paternal Uncle        brain/lung   Diabetes Paternal Uncle    Crohn's disease Maternal Grandmother    Cancer Maternal Grandmother     Past Surgical History:  Procedure Laterality Date   APPENDECTOMY  [id number]   CESAREAN SECTION     LAPAROSCOPIC APPENDECTOMY  10/13/2011   Procedure: APPENDECTOMY LAPAROSCOPIC;  Surgeon: Emelia Loron, MD;  Location: [facility];  Service: General;  Laterality: N/A;   LAPAROSCOPY  10/13/2011   Procedure: LAPAROSCOPY DIAGNOSTIC;  Surgeon:  Emelia Loron, MD;  Location: [facility];  Service: General;  Laterality: N/A;   wisdon teeth extraction     Social History   Occupational History   Not on file  Tobacco Use   Smoking status: Never   Smokeless tobacco: Never  Vaping Use   Vaping Use: Never used  Substance and Sexual Activity   Alcohol use: No   Drug use: No   Sexual activity: Never

## 2022-02-03 LAB — HEPATITIS C ANTIBODY: HCV Ab: NEGATIVE

## 2022-02-03 LAB — OB RESULTS CONSOLE HIV ANTIBODY (ROUTINE TESTING): HIV: NONREACTIVE

## 2022-02-03 LAB — OB RESULTS CONSOLE RUBELLA ANTIBODY, IGM: Rubella: IMMUNE

## 2022-02-03 LAB — OB RESULTS CONSOLE GBS: GBS: NEGATIVE

## 2022-02-03 LAB — OB RESULTS CONSOLE ANTIBODY SCREEN: Antibody Screen: NEGATIVE

## 2022-02-03 LAB — OB RESULTS CONSOLE RPR: RPR: NONREACTIVE

## 2022-02-03 LAB — OB RESULTS CONSOLE ABO/RH: RH Type: POSITIVE

## 2022-02-03 LAB — OB RESULTS CONSOLE HEPATITIS B SURFACE ANTIGEN: Hepatitis B Surface Ag: NEGATIVE

## 2022-02-09 LAB — OB RESULTS CONSOLE GC/CHLAMYDIA
Chlamydia: NEGATIVE
Neisseria Gonorrhea: NEGATIVE

## 2022-02-13 ENCOUNTER — Inpatient Hospital Stay (HOSPITAL_COMMUNITY): Payer: 59

## 2022-02-13 ENCOUNTER — Encounter: Payer: Self-pay | Admitting: Advanced Practice Midwife

## 2022-02-13 ENCOUNTER — Inpatient Hospital Stay (HOSPITAL_COMMUNITY)
Admission: AD | Admit: 2022-02-13 | Discharge: 2022-02-13 | Disposition: A | Discharge status: Home / Self Care | Payer: 59 | Attending: Obstetrics & Gynecology | Admitting: Obstetrics & Gynecology

## 2022-02-13 DIAGNOSIS — O24111 Pre-existing diabetes mellitus, type 2, in pregnancy, first trimester: Secondary | ICD-10-CM | POA: Diagnosis not present

## 2022-02-13 DIAGNOSIS — Z3A12 12 weeks gestation of pregnancy: Secondary | ICD-10-CM | POA: Diagnosis not present

## 2022-02-13 DIAGNOSIS — O209 Hemorrhage in early pregnancy, unspecified: Secondary | ICD-10-CM | POA: Diagnosis not present

## 2022-02-13 DIAGNOSIS — Z3490 Encounter for supervision of normal pregnancy, unspecified, unspecified trimester: Secondary | ICD-10-CM

## 2022-02-13 LAB — CBC
HCT: 34.1 % — ABNORMAL LOW (ref 36.0–46.0)
Hemoglobin: 12.2 g/dL (ref 12.0–15.0)
MCH: 30.9 pg (ref 26.0–34.0)
MCHC: 35.8 g/dL (ref 30.0–36.0)
MCV: 86.3 fL (ref 80.0–100.0)
Platelets: 265 10*3/uL (ref 150–400)
RBC: 3.95 MIL/uL (ref 3.87–5.11)
RDW: 12.4 % (ref 11.5–15.5)
WBC: 8.7 10*3/uL (ref 4.0–10.5)
nRBC: 0 % (ref 0.0–0.2)

## 2022-02-13 LAB — HCG, QUANTITATIVE, PREGNANCY: hCG, Beta Chain, Quant, S: 57388 m[IU]/mL — ABNORMAL HIGH (ref <5)

## 2022-02-13 NOTE — MAU Note (Signed)
.  Ashley Hester is a 31 y.o. at [redacted]w[redacted]d here in MAU reporting: She started having heavy vag bleeding this afternoon passing clots and mild  cramping. No recent intercourse. Had u/s this week in office and everything was normal  LMP:  Onset of complaint: 3pm Pain score: 3 Vitals:   02/13/22 1716  BP: 115/66  Pulse: 68  Resp: 18  Temp: 98.2 F (36.8 C)     FHT:156 Lab orders placed from triage:  u/a

## 2022-02-13 NOTE — MAU Provider Note (Cosign Needed Addendum)
History     CSN: [id number]  Arrival date and time: 02/13/22 1555   None     Chief Complaint  Patient presents with   Vaginal Bleeding   Ashley Hester is a 31 y.o. [id number] at [redacted]w[redacted]d who presents today with vaginal bleeding.   Vaginal Bleeding    OB History     Gravida  3   Para  1   Term  0   Preterm  1   AB  1   Living  1      SAB  1   IAB  0   Ectopic  0   Multiple  0   Live Births              Past Medical History:  Diagnosis Date   ADHD (attention deficit hyperactivity disorder)    Asthma    sport induced   Diabetes mellitus without complication (HCC)    Dyspnea    PONV (postoperative nausea and vomiting)    Hard to wake up   UTI (lower urinary tract infection)     Past Surgical History:  Procedure Laterality Date   APPENDECTOMY  [id number]   CESAREAN SECTION     LAPAROSCOPIC APPENDECTOMY  10/13/2011   Procedure: APPENDECTOMY LAPAROSCOPIC;  Surgeon: Emelia Loron, MD;  Location: [facility];  Service: General;  Laterality: N/A;   LAPAROSCOPY  10/13/2011   Procedure: LAPAROSCOPY DIAGNOSTIC;  Surgeon: Emelia Loron, MD;  Location: [facility];  Service: General;  Laterality: N/A;   wisdon teeth extraction      Family History  Problem Relation Age of Onset   Cancer Mother        Thyroid   Hypertension Mother    Cancer Paternal Uncle        brain/lung   Diabetes Paternal Uncle    Crohn's disease Maternal Grandmother    Cancer Maternal Grandmother     Social History   Tobacco Use   Smoking status: Never   Smokeless tobacco: Never  Vaping Use   Vaping Use: Never used  Substance Use Topics   Alcohol use: No   Drug use: No    Allergies: No Known Allergies  No medications prior to admission.    Review of Systems  Genitourinary:  Positive for vaginal bleeding.  All other systems reviewed and are negative.  Physical Exam   Blood pressure 115/66, pulse 68, temperature 98.2 F (36.8 C), resp. rate 18, height 5\' 4"  (1.626  m), weight 70.3 kg, last menstrual period 11/18/2021, unknown if currently breastfeeding.  Physical Exam Constitutional:      Appearance: She is well-developed.  HENT:     Head: Normocephalic.  Eyes:     Pupils: Pupils are equal, round, and reactive to light.  Cardiovascular:     Rate and Rhythm: Normal rate and regular rhythm.     Heart sounds: Normal heart sounds.  Pulmonary:     Effort: Pulmonary effort is normal. No respiratory distress.     Breath sounds: Normal breath sounds.  Abdominal:     Palpations: Abdomen is soft.     Tenderness: There is no abdominal tenderness.  Genitourinary:    Vagina: No bleeding. Vaginal discharge: mucusy.    Comments: External: no lesion Vagina: small amount of white discharge     Musculoskeletal:        General: Normal range of motion.     Cervical back: Normal range of motion and neck supple.  Skin:    General: Skin is  warm and dry.  Neurological:     Mental Status: She is alert and oriented to person, place, and time.  Psychiatric:        Mood and Affect: Mood normal.        Behavior: Behavior normal.    Results for orders placed or performed during the hospital encounter of 02/13/22 (from the past 24 hour(s))  CBC     Status: Abnormal   Collection Time: 02/13/22  4:48 PM  Result Value Ref Range   WBC 8.7 4.0 - 10.5 K/uL   RBC 3.95 3.87 - 5.11 MIL/uL   Hemoglobin 12.2 12.0 - 15.0 g/dL   HCT 97.4 (L) 16.3 - 84.5 %   MCV 86.3 80.0 - 100.0 fL   MCH 30.9 26.0 - 34.0 pg   MCHC 35.8 30.0 - 36.0 g/dL   RDW 36.4 68.0 - 32.1 %   Platelets 265 150 - 400 K/uL   nRBC 0.0 0.0 - 0.2 %    US OB Comp Less 14 Wks  Result Date: 02/13/2022 CLINICAL DATA:  Heavy vaginal bleeding 1 day EXAM: OBSTETRIC <14 WK ULTRASOUND TECHNIQUE: Transabdominal ultrasound was performed for evaluation of the gestation as well as the maternal uterus and adnexal regions. COMPARISON:  None Available. FINDINGS: Intrauterine gestational sac: Single Yolk sac:  Not  Visualized. Embryo:  Visualized. Cardiac Activity: Visualized. Heart Rate: 151 bpm CRL: 59.2 mm   12 w 3 d                  [place] EDC: 08/25/2022 Subchorionic hemorrhage:  None visualized. Maternal uterus/adnexae: Unremarkable. IMPRESSION: Single intrauterine gestation at sonographic gestational age of [redacted] weeks, 3 days. EDD 08/25/2022. Fetal heart rate 151 beats per minute. Electronically Signed   By: Jearld Lesch M.D.   On: 02/13/2022 18:09    MAU Course  Procedures  MDM   Assessment and Plan   1. Vaginal bleeding in pregnancy, first trimester   2. Intrauterine pregnancy   3. [redacted] weeks gestation of pregnancy    DC home in stable condition  Comfort measures reviewed  1st/2nd Trimester precautions  Bleeding precautions RX: none  Return to MAU as needed FU with OB as planned   Follow-up Information     Morris, Megan, DO Follow up.   Specialty: Obstetrics and Gynecology Contact information: [address] [place] [address] [phone]

## 2022-06-19 ENCOUNTER — Other Ambulatory Visit: Payer: Self-pay | Admitting: Obstetrics & Gynecology

## 2022-06-19 DIAGNOSIS — Z331 Pregnant state, incidental: Secondary | ICD-10-CM

## 2022-06-19 DIAGNOSIS — Z363 Encounter for antenatal screening for malformations: Secondary | ICD-10-CM

## 2022-06-21 ENCOUNTER — Encounter (HOSPITAL_COMMUNITY): Payer: Self-pay | Admitting: Obstetrics and Gynecology

## 2022-06-21 ENCOUNTER — Inpatient Hospital Stay (HOSPITAL_COMMUNITY)
Admission: AD | Admit: 2022-06-21 | Discharge: 2022-06-21 | Disposition: A | Discharge status: Home / Self Care | Payer: 59 | Source: Ambulatory Visit | Attending: Obstetrics and Gynecology | Admitting: Obstetrics and Gynecology

## 2022-06-21 DIAGNOSIS — Z3A30 30 weeks gestation of pregnancy: Secondary | ICD-10-CM | POA: Diagnosis not present

## 2022-06-21 DIAGNOSIS — Z794 Long term (current) use of insulin: Secondary | ICD-10-CM | POA: Insufficient documentation

## 2022-06-21 DIAGNOSIS — O24013 Pre-existing diabetes mellitus, type 1, in pregnancy, third trimester: Secondary | ICD-10-CM | POA: Insufficient documentation

## 2022-06-21 DIAGNOSIS — O26893 Other specified pregnancy related conditions, third trimester: Secondary | ICD-10-CM | POA: Insufficient documentation

## 2022-06-21 DIAGNOSIS — O479 False labor, unspecified: Secondary | ICD-10-CM | POA: Diagnosis not present

## 2022-06-21 LAB — CBC WITH DIFFERENTIAL/PLATELET
Abs Immature Granulocytes: 0.15 10*3/uL — ABNORMAL HIGH (ref 0.00–0.07)
Basophils Absolute: 0 10*3/uL (ref 0.0–0.1)
Basophils Relative: 0 %
Eosinophils Absolute: 0.4 10*3/uL (ref 0.0–0.5)
Eosinophils Relative: 3 %
HCT: 30.6 % — ABNORMAL LOW (ref 36.0–46.0)
Hemoglobin: 10.4 g/dL — ABNORMAL LOW (ref 12.0–15.0)
Immature Granulocytes: 1 %
Lymphocytes Relative: 15 %
Lymphs Abs: 1.7 10*3/uL (ref 0.7–4.0)
MCH: 30.6 pg (ref 26.0–34.0)
MCHC: 34 g/dL (ref 30.0–36.0)
MCV: 90 fL (ref 80.0–100.0)
Monocytes Absolute: 0.7 10*3/uL (ref 0.1–1.0)
Monocytes Relative: 6 %
Neutro Abs: 8.5 10*3/uL — ABNORMAL HIGH (ref 1.7–7.7)
Neutrophils Relative %: 75 %
Platelets: 214 10*3/uL (ref 150–400)
RBC: 3.4 MIL/uL — ABNORMAL LOW (ref 3.87–5.11)
RDW: 13.4 % (ref 11.5–15.5)
WBC: 11.3 10*3/uL — ABNORMAL HIGH (ref 4.0–10.5)
nRBC: 0 % (ref 0.0–0.2)

## 2022-06-21 LAB — COMPREHENSIVE METABOLIC PANEL
ALT: 12 U/L (ref 0–44)
AST: 14 U/L — ABNORMAL LOW (ref 15–41)
Albumin: 2.6 g/dL — ABNORMAL LOW (ref 3.5–5.0)
Alkaline Phosphatase: 70 U/L (ref 38–126)
Anion gap: 9 (ref 5–15)
BUN: 11 mg/dL (ref 6–20)
CO2: 23 mmol/L (ref 22–32)
Calcium: 9.1 mg/dL (ref 8.9–10.3)
Chloride: 103 mmol/L (ref 98–111)
Creatinine, Ser: 0.81 mg/dL (ref 0.44–1.00)
GFR, Estimated: >60 mL/min (ref >60)
Glucose, Bld: 138 mg/dL — ABNORMAL HIGH (ref 70–99)
Potassium: 4.2 mmol/L (ref 3.5–5.1)
Sodium: 135 mmol/L (ref 135–145)
Total Bilirubin: 0.6 mg/dL (ref 0.3–1.2)
Total Protein: 6.1 g/dL — ABNORMAL LOW (ref 6.5–8.1)

## 2022-06-21 LAB — PROTEIN / CREATININE RATIO, URINE
Creatinine, Urine: 46 mg/dL
Protein Creatinine Ratio: 0.17 mg/mg{Cre} — ABNORMAL HIGH (ref 0.00–0.15)
Total Protein, Urine: 8 mg/dL

## 2022-06-21 LAB — APTT: aPTT: 27 seconds (ref 24–36)

## 2022-06-21 MED ORDER — FENTANYL CITRATE (PF) 100 MCG/2ML IJ SOLN: 12.5000 ug | Freq: Once | INTRAMUSCULAR | Status: DC

## 2022-06-21 MED ORDER — TERBUTALINE SULFATE 1 MG/ML IJ SOLN
0.2500 mg | Freq: Once | INTRAMUSCULAR | Status: AC
  Administered 2022-06-21: 0.25 mg via SUBCUTANEOUS
  Filled 2022-06-21: qty 1

## 2022-06-21 MED ORDER — LACTATED RINGERS IV BOLUS
1000.0000 mL | Freq: Once | INTRAVENOUS | Status: AC
  Administered 2022-06-21: 1000 mL via INTRAVENOUS

## 2022-06-21 NOTE — MAU Note (Signed)
Ashley Hester is a 32 y.o. at [redacted]w[redacted]d here in MAU reporting: started having contractions earlier this wk, they were sporadic.  Today, they have become more regular, taking her breath away, now every 10 min.   Hands and feet are swelling.  Having pain in RUQ- started on Thursday.  Hx of severe Pre- e, BP was never high.  Dr wanted her to come get checked out. Denies  HA, vision is not blurring- but feels off. Denies bleeding or water leaking. Reports some fetal movement, not as much.  Onset of complaint: few day ago Pain score: 5 Vitals:   06/21/22 1503  BP: 131/63  Pulse: 97  Resp: 17  Temp: 98.2 F (36.8 C)  SpO2: 98%     FHT:140 Lab orders placed from triage:  urine collected

## 2022-06-21 NOTE — MAU Provider Note (Signed)
MAU Provider Note  History  [id number]  Arrival date and time: 06/21/22 1443   Chief Complaint  Patient presents with   Abdominal Pain   Contractions   swollen hands   HPI Ashley Hester is a 32 y.o. [id number] at [redacted]w[redacted]d by LMP with PMHx notable for history of severe preeclampsia, type 1 diabetes, asthma, history of DKA and severe metabolic acidosis leading to respiratory depression and subsequent intubation, who presents for contractions and evaluation for preeclampsia. Noted RUQ pain and LE swelling that has been worsening over 1 week. Mild change in vision, which she attribuites to her T1DM.    On 12/09/2016, patient was emergently transferred from [facility] to [facility] due to DKA, severe metabolic acidosis requiring intubation. Subsequent proteinuria with severe range BP lead to IOL at 34 weeks. Eventual C/S due to NRFHT. Pt has elevated LFTs and thrombocytopenia w/o BP in severe ranges.   Vaginal bleeding: No LOF: No Fetal Movement: Yes Contractions: Yes     OB History     Gravida  3   Para  1   Term  0   Preterm  1   AB  1   Living  1      SAB  1   IAB  0   Ectopic  0   Multiple  0   Live Births              Past Medical History:  Diagnosis Date   ADHD (attention deficit hyperactivity disorder)    Asthma    sport induced   Diabetes mellitus without complication (HCC)    Dyspnea    PONV (postoperative nausea and vomiting)    Hard to wake up   UTI (lower urinary tract infection)     Past Surgical History:  Procedure Laterality Date   APPENDECTOMY  [id number]   CESAREAN SECTION     LAPAROSCOPIC APPENDECTOMY  10/13/2011   Procedure: APPENDECTOMY LAPAROSCOPIC;  Surgeon: Emelia Loron, MD;  Location: [facility];  Service: General;  Laterality: N/A;   LAPAROSCOPY  10/13/2011   Procedure: LAPAROSCOPY DIAGNOSTIC;  Surgeon: Emelia Loron, MD;  Location: [facility];  Service: General;  Laterality: N/A;   wisdon teeth extraction      Family History   Problem Relation Age of Onset   Cancer Mother        Thyroid   Hypertension Mother    Cancer Paternal Uncle        brain/lung   Diabetes Paternal Uncle    Crohn's disease Maternal Grandmother    Cancer Maternal Grandmother     Social History   Socioeconomic History   Marital status: Married    Spouse name: Not on file   Number of children: Not on file   Years of education: Not on file   Highest education level: Not on file  Occupational History   Not on file  Tobacco Use   Smoking status: Never   Smokeless tobacco: Never  Vaping Use   Vaping Use: Never used  Substance and Sexual Activity   Alcohol use: No   Drug use: No   Sexual activity: Not Currently  Other Topics Concern   Not on file  Social History Narrative   Not on file   Social Determinants of Health   Financial Resource Strain: Not on file  Food Insecurity: Not on file  Transportation Needs: Not on file  Physical Activity: Not on file  Stress: Not on file  Social Connections: Not on file  Intimate Partner Violence: Not on file    No Known Allergies  Current Facility-Administered Medications on File Prior to Encounter  Medication Dose Route Frequency Provider Last Rate Last Admin   propofol (DIPRIVAN) 10 mg/mL bolus/IV push    Anesthesia Intra-op Yolonda Kida, CRNA   15 mg at 12/08/16 2126   succinylcholine (ANECTINE) injection    Anesthesia Intra-op Yolonda Kida, CRNA   100 mg at 12/08/16 1908   Current Outpatient Medications on File Prior to Encounter  Medication Sig Dispense Refill   aspirin EC 81 MG tablet Take 81 mg by mouth daily. Swallow whole.     Prenatal Vit-Fe Fumarate-FA (PRENATAL MULTIVITAMIN) TABS tablet Take 1 tablet by mouth daily at 12 noon.       Review of Systems  Constitutional:  Negative for appetite change, chills and fever.  HENT:  Negative for congestion, facial swelling and postnasal drip.   Eyes:  Positive for visual disturbance. Negative for photophobia.   Respiratory:  Negative for cough and shortness of breath.   Cardiovascular:  Positive for leg swelling. Negative for chest pain.  Gastrointestinal:  Positive for abdominal pain. Negative for nausea and vomiting.  Endocrine: Negative for polyuria.  Genitourinary:  Negative for flank pain and pelvic pain.  Musculoskeletal:  Negative for arthralgias.  Skin:  Negative for rash.  Neurological:  Negative for weakness and light-headedness.  Psychiatric/Behavioral:  Negative for confusion.     Pertinent positives and negative per HPI, all others reviewed and negative  Physical Exam   BP 118/70   Pulse 85   Temp 98.2 F (36.8 C) (Oral)   Resp 17   Ht 5\' 4"  (1.626 m)   Wt 85.6 kg   LMP 11/18/2021   SpO2 99%   BMI 32.39 kg/m   Patient Vitals for the past 24 hrs:  BP Temp Temp src Pulse Resp SpO2 Height Weight  06/21/22 1731 118/70 -- -- 85 -- -- -- --  06/21/22 1716 (!) 103/57 -- -- 89 -- -- -- --  06/21/22 1701 115/68 -- -- 89 -- -- -- --  06/21/22 1646 117/70 -- -- 94 -- -- -- --  06/21/22 1631 122/63 -- -- 94 -- -- -- --  06/21/22 1620 -- -- -- -- -- 99 % -- --  06/21/22 1601 112/72 -- -- 88 17 -- -- --  06/21/22 1600 -- -- -- -- -- 98 % -- --  06/21/22 1546 112/62 -- -- 90 -- -- -- --  06/21/22 1531 122/68 -- -- 93 -- -- -- --  06/21/22 1522 118/68 -- -- 93 -- -- -- --  06/21/22 1503 131/63 98.2 F (36.8 C) Oral 97 17 98 % 5\' 4"  (1.626 m) 85.6 kg    Physical Exam Vitals reviewed.  Constitutional:      Appearance: Normal appearance.  HENT:     Head: Normocephalic and atraumatic.     Right Ear: External ear normal.     Left Ear: External ear normal.     Nose: Nose normal.     Mouth/Throat:     Pharynx: Oropharynx is clear.  Eyes:     Conjunctiva/sclera: Conjunctivae normal.  Cardiovascular:     Rate and Rhythm: Normal rate and regular rhythm.  Pulmonary:     Effort: Pulmonary effort is normal.  Abdominal:     Palpations: Abdomen is soft.     Comments: Gravid   Genitourinary:    General: Normal vulva.     Vagina: No vaginal discharge.  Rectum: Normal.     Comments: SVE Dilation: 1 Effacement (%): Thick Cervical Position: Posterior Station: -3 Presentation: Vertex Exam by:: Dr. Camelia PhenesRobb Matar  Skin:    General: Skin is warm.     Capillary Refill: Capillary refill takes less than 2 seconds.  Neurological:     Mental Status: Mental status is at baseline.  Psychiatric:        Mood and Affect: Mood normal.    Cervical Exam Dilation: 1 Effacement (%): Thick Cervical Position: Posterior Station: -3 Presentation: Vertex Exam by:: Dr. Lanae Crumbly  Bedside Ultrasound not done  FHT Baseline 130 bpm, mod variability, 15x15accels, no decels Toco: inconsistent Cat: 1  Labs Results for orders placed or performed during the hospital encounter of 06/21/22 (from the past 24 hour(s))  Protein / creatinine ratio, urine     Status: Abnormal   Collection Time: 06/21/22  3:15 PM  Result Value Ref Range   Creatinine, Urine 46 mg/dL   Total Protein, Urine 8 mg/dL   Protein Creatinine Ratio 0.17 (H) 0.00 - 0.15 mg/mg[Cre]  CBC with Differential/Platelet     Status: Abnormal   Collection Time: 06/21/22  3:42 PM  Result Value Ref Range   WBC 11.3 (H) 4.0 - 10.5 K/uL   RBC 3.40 (L) 3.87 - 5.11 MIL/uL   Hemoglobin 10.4 (L) 12.0 - 15.0 g/dL   HCT 78.2 (L) 95.6 - 21.3 %   MCV 90.0 80.0 - 100.0 fL   MCH 30.6 26.0 - 34.0 pg   MCHC 34.0 30.0 - 36.0 g/dL   RDW 08.6 57.8 - 46.9 %   Platelets 214 150 - 400 K/uL   nRBC 0.0 0.0 - 0.2 %   Neutrophils Relative % 75 %   Neutro Abs 8.5 (H) 1.7 - 7.7 K/uL   Lymphocytes Relative 15 %   Lymphs Abs 1.7 0.7 - 4.0 K/uL   Monocytes Relative 6 %   Monocytes Absolute 0.7 0.1 - 1.0 K/uL   Eosinophils Relative 3 %   Eosinophils Absolute 0.4 0.0 - 0.5 K/uL   Basophils Relative 0 %   Basophils Absolute 0.0 0.0 - 0.1 K/uL   Immature Granulocytes 1 %   Abs Immature Granulocytes 0.15 (H) 0.00 - 0.07 K/uL   Comprehensive metabolic panel     Status: Abnormal   Collection Time: 06/21/22  3:42 PM  Result Value Ref Range   Sodium 135 135 - 145 mmol/L   Potassium 4.2 3.5 - 5.1 mmol/L   Chloride 103 98 - 111 mmol/L   CO2 23 22 - 32 mmol/L   Glucose, Bld 138 (H) 70 - 99 mg/dL   BUN 11 6 - 20 mg/dL   Creatinine, Ser 6.29 0.44 - 1.00 mg/dL   Calcium 9.1 8.9 - 52.8 mg/dL   Total Protein 6.1 (L) 6.5 - 8.1 g/dL   Albumin 2.6 (L) 3.5 - 5.0 g/dL   AST 14 (L) 15 - 41 U/L   ALT 12 0 - 44 U/L   Alkaline Phosphatase 70 38 - 126 U/L   Total Bilirubin 0.6 0.3 - 1.2 mg/dL   GFR, Estimated >41 >32 mL/min   Anion gap 9 5 - 15  APTT     Status: None   Collection Time: 06/21/22  3:42 PM  Result Value Ref Range   aPTT 27 24 - 36 seconds    Imaging No results found.  MAU Course  Procedures Lab Orders         CBC with Differential/Platelet  Comprehensive metabolic panel         Protein / creatinine ratio, urine         APTT    Meds ordered this encounter  Medications   DISCONTD: fentaNYL (SUBLIMAZE) injection 12.5 mcg   lactated ringers bolus 1,000 mL   terbutaline (BRETHINE) injection 0.25 mg   Imaging Orders  No imaging studies ordered today    MDM moderate  Assessment and Plan  Contractions Pre-eclampsia rule out Fetal Well being As above Discussed patient with RN. NST reviewed.   Pt her for contractions and concern for Pre-E. Remote h/o severe Pre-E without elevated BP. Noted RUQ pain and LE swelling that has been worsening over 1 week. Mild change in vision, which she attribuites to her T1DM. SVE 1/thick/posterior and unchanged on recheck. PreE labs unremarkable.  Patient still contracting every 2 to 5 minutes, gave terbutaline. Discussed with primary OB who recommended discharge home and close follow-up in clinic this week.  Gave return and preterm labor precautions.  Follow-up with primary OB.  Dispo: discharged to home in stable condition.   Discharge Instructions      Discharge patient   Complete by: As directed    Discharge disposition: 01-Home or Self Care   Discharge patient date: 06/21/2022      Allergies as of 06/21/2022   No Known Allergies      Medication List     TAKE these medications    aspirin EC 81 MG tablet Take 81 mg by mouth daily. Swallow whole.   prenatal multivitamin Tabs tablet Take 1 tablet by mouth daily at 12 noon.         Shelda Pal, [place] Fellow, Faculty practice [place] for [facility] 06/21/22  5:43 PM

## 2022-07-02 ENCOUNTER — Encounter: Payer: Self-pay | Admitting: Obstetrics and Gynecology

## 2022-07-14 ENCOUNTER — Ambulatory Visit: Payer: 59 | Admitting: *Deleted

## 2022-07-14 ENCOUNTER — Ambulatory Visit: Payer: 59 | Attending: Obstetrics & Gynecology

## 2022-07-14 ENCOUNTER — Ambulatory Visit (HOSPITAL_BASED_OUTPATIENT_CLINIC_OR_DEPARTMENT_OTHER): Payer: 59 | Admitting: Maternal & Fetal Medicine

## 2022-07-14 ENCOUNTER — Encounter: Payer: Self-pay | Admitting: *Deleted

## 2022-07-14 ENCOUNTER — Other Ambulatory Visit: Payer: Self-pay | Admitting: Obstetrics & Gynecology

## 2022-07-14 DIAGNOSIS — O24013 Pre-existing diabetes mellitus, type 1, in pregnancy, third trimester: Secondary | ICD-10-CM | POA: Diagnosis present

## 2022-07-14 DIAGNOSIS — Z363 Encounter for antenatal screening for malformations: Secondary | ICD-10-CM

## 2022-07-14 DIAGNOSIS — Z331 Pregnant state, incidental: Secondary | ICD-10-CM | POA: Insufficient documentation

## 2022-07-14 DIAGNOSIS — Z3A34 34 weeks gestation of pregnancy: Secondary | ICD-10-CM | POA: Diagnosis present

## 2022-07-14 NOTE — Progress Notes (Signed)
MFM Consult Note Patient Name: Ashley Hester  Patient MRN:   [id number]  Referring provider: [facility]  Reason for Consult: Timing of delivery in type I DM   HPI: Ashley Hester is a 32 y.o. [id number] at [redacted]w[redacted]d here for ultrasound and consultation.   Ashley Hester was referred here for type 1 diabetes and delivery timing recommendations.  She has longstanding diabetes and has a history of renal insufficiency with a creatinine baseline of around 1.1.  Thankfully her creatinine is now down to 0.81 and she has tolerated pregnancy well until recently. In her prior pregnancy she went into DKA with respiratory failure and was intubated from 3 days prior to delivery per her report. She is very concerned about another DKA episode. She has a continuous glucose monitor as well as a pump to where she has been controlled between 70-110 on her glucose reader 79% of the time.  However, recently she reports that her blood sugars are around 180s to over 200 after meals and even sometimes when she does not eat. She is open to admission in the hospital if needed if her blood sugars cannot be well-controlled.  I encouraged her to reach out to her endocrinologist and see if she can have weekly visits to help adjust her insulin.  I discussed the risks of poorly controlled diabetes in pregnancy including but not limited to  stillbirth, low levels of oxygen in the placenta, macrosomia, post birth complications in the NICU. She had a fetal echo [facility]) that showed trivial pericardial effusion but is now resolved.  Review of Systems: A review of systems was performed and was negative except per HPI   Vitals and Physical Exam See intake sheet for vitals Sitting comfortably on the sonogram table Nonlabored breathing Normal rate and rhythm Abdomen is nontender  Genetic testing: low risk NIPS  Sonographic findings Single intrauterine pregnancy. Fetal cardiac activity:  Observed and appears normal. Presentation:  Cephalic. The anatomic structures that were well seen appear normal except for a persistent right umbilical vein. Due to fetal position and advanced gestational age, visualization some structures remain suboptimally seen. Fetal biometry shows the estimated fetal weight at the 91st percentile and the abdominal circumference at the 99th percentile.  Amniotic fluid volume: Within normal limits. MVP: 5.43 cm. Placenta: Anterior.  Assessment -Type 1 diabetes in pregnancy, poor control -Persistent right umbilical vein Plan -Weekly visits with endocrinology to continue to adjust her insulin -Testing has been scheduled with her referring provider's office -Serial growth ultrasounds are typically recommended but she will be delivered likely by the time the next would be due -She had a fetal echo [facility]) that showed trivial pericardial effusion but is now resolved -Sedation for glucose control and patterning is indicated in patients with persistent blood sugars over 200 despite increases in insulin or DKA. -Delivery timing can be 37 weeks given her poor control and large for gestational age fetus with the abdominal circumference in the 99th percentile. BPP 8/8. -Since the persistent right umbilical vein appears to be isolated it should not cause any problems during the pregnancy.   I spent 60 minutes reviewing the patients chart, including labs and images as well as counseling the patient about her medical conditions.  Ashley Hester  MFM, [place]   07/14/2022  12:57 PM

## 2022-07-19 ENCOUNTER — Inpatient Hospital Stay (HOSPITAL_COMMUNITY)
Admission: AD | Admit: 2022-07-19 | Discharge: 2022-07-20 | Disposition: A | Discharge status: Home / Self Care | Payer: 59 | Attending: Obstetrics and Gynecology | Admitting: Obstetrics and Gynecology

## 2022-07-19 ENCOUNTER — Encounter (HOSPITAL_COMMUNITY): Payer: Self-pay | Admitting: Obstetrics and Gynecology

## 2022-07-19 DIAGNOSIS — E109 Type 1 diabetes mellitus without complications: Secondary | ICD-10-CM | POA: Diagnosis not present

## 2022-07-19 DIAGNOSIS — Z794 Long term (current) use of insulin: Secondary | ICD-10-CM | POA: Diagnosis not present

## 2022-07-19 DIAGNOSIS — Z3A34 34 weeks gestation of pregnancy: Secondary | ICD-10-CM | POA: Insufficient documentation

## 2022-07-19 DIAGNOSIS — O26893 Other specified pregnancy related conditions, third trimester: Secondary | ICD-10-CM | POA: Insufficient documentation

## 2022-07-19 DIAGNOSIS — R519 Headache, unspecified: Secondary | ICD-10-CM | POA: Insufficient documentation

## 2022-07-19 DIAGNOSIS — R002 Palpitations: Secondary | ICD-10-CM | POA: Insufficient documentation

## 2022-07-19 DIAGNOSIS — O24013 Pre-existing diabetes mellitus, type 1, in pregnancy, third trimester: Secondary | ICD-10-CM | POA: Diagnosis not present

## 2022-07-19 DIAGNOSIS — R0602 Shortness of breath: Secondary | ICD-10-CM | POA: Diagnosis not present

## 2022-07-19 DIAGNOSIS — R079 Chest pain, unspecified: Secondary | ICD-10-CM | POA: Diagnosis present

## 2022-07-19 LAB — GLUCOSE, CAPILLARY: Glucose-Capillary: 195 mg/dL — ABNORMAL HIGH (ref 70–99)

## 2022-07-19 MED ORDER — ACETAMINOPHEN-CAFFEINE 500-65 MG PO TABS
1.0000 | ORAL_TABLET | Freq: Once | ORAL | Status: AC
  Administered 2022-07-19: 1 via ORAL
  Filled 2022-07-19: qty 1

## 2022-07-19 NOTE — MAU Note (Signed)
..  Ashley Hester is a 32 y.o. at [redacted]w[redacted]d here in MAU reporting: allergic reaction to her insulin (warmth, redness, and welting at injection site) Last dose was at 8 pm. Last BS on continuous glucose monitor was 193- reports she is due for another dose of insulin but is afraid to take it. Has had this reaction every time she injects the insulin, was on a pump but was taken off d/t this reaction.   Occasionally after she takes her insulin she feels elevated heart rate. Has had this feeling before but is concerned that this time it is related to the insulin.   Reports headache that has been going on for a week but worsened tonight around 2130.  Denies vaginal bleeding or leaking of fluid. +FM. Reports irregular ctx (5-10 minutes apart)  Pain score: headache 6/10; ctx 3/10 Vitals:   07/19/22 2257  BP: 120/70  Pulse: (!) 104  Resp: 16  Temp: (!) 97.5 F (36.4 C)  SpO2: 100%     OMV:EHMCNOB in room 130's

## 2022-07-19 NOTE — MAU Provider Note (Signed)
History     CSN: [id number]  Arrival date and time: 07/19/22 2231   Event Date/Time   First Provider Initiated Contact with Patient 07/19/22 2302      Chief Complaint  Patient presents with   Allergic Reaction   Ashley Hester , a  32 y.o. [phone] at [redacted]w[redacted]d presents to MAU with complaints of a headache, palpitations and allergic reaction at the injection site for her insulin. Patient reports a on-going headache since last week but states headache has gotten increasingly worse throughout the day and more recently tonight. She denies attempting to relieve pain. Currently rating headache a 8/10. She states she always has visual changes d/t the Type I DM but states she has not experienced anything out of the normal. She does endorse right sided epigastric pain that has been consistent all day. She denies swelling nausea and vomiting.   Palpitations: Patient endorses episodic palpitations that have been occurring since the change in insulin. Patient reports an episode tonight about 9:30 pm. She denies chest pain, but endorses Shortness of breath and chest "tightness." She states she is also experiencing some "hot flashes" but denies sweating or feeling clammy. Denies history of anxiety and asthma.   Allergic reactions: Patient states when she gives herself her insulin the injection site becomes warm to touch, sore and raised for days. She noted that it will turn blue like a bruise for several days. She denies spreading or feeling SOB, dizziness at time of injection. She states her last injection was at 23:18 her BG was 210 and she gave herself a correction dose of 8 units. Current Dexcom reading is 207 @ 2341.   She endorses positive fetal movement and no other obstetrical complaints.           OB History     Gravida  3   Para  1   Term  0   Preterm  1   AB  1   Living  1      SAB  1   IAB  0   Ectopic  0   Multiple  0   Live Births              Past Medical  History:  Diagnosis Date   ADHD (attention deficit hyperactivity disorder)    Asthma    sport induced   Diabetes mellitus without complication (HCC)    Dyspnea    PONV (postoperative nausea and vomiting)    Hard to wake up   UTI (lower urinary tract infection)     Past Surgical History:  Procedure Laterality Date   APPENDECTOMY  [id number]   CESAREAN SECTION     LAPAROSCOPIC APPENDECTOMY  10/13/2011   Procedure: APPENDECTOMY LAPAROSCOPIC;  Surgeon: Rolm Bookbinder, MD;  Location: [facility];  Service: General;  Laterality: N/A;   LAPAROSCOPY  10/13/2011   Procedure: LAPAROSCOPY DIAGNOSTIC;  Surgeon: Rolm Bookbinder, MD;  Location: [facility];  Service: General;  Laterality: N/A;   wisdon teeth extraction      Family History  Problem Relation Age of Onset   Cancer Mother        Thyroid   Hypertension Mother    Cancer Paternal Uncle        brain/lung   Diabetes Paternal Uncle    Crohn's disease Maternal Grandmother    Cancer Maternal Grandmother     Social History   Tobacco Use   Smoking status: Never   Smokeless tobacco: Never  Vaping Use  Vaping Use: Never used  Substance Use Topics   Alcohol use: No   Drug use: No    Allergies: No Known Allergies  Medications Prior to Admission  Medication Sig Dispense Refill Last Dose   aspirin EC 81 MG tablet Take 81 mg by mouth daily. Swallow whole.   07/19/2022   Insulin Human (INSULIN PUMP) SOLN Inject into the skin.   07/19/2022   Prenatal Vit-Fe Fumarate-FA (PRENATAL MULTIVITAMIN) TABS tablet Take 1 tablet by mouth daily at 12 noon.   07/19/2022    Review of Systems  Constitutional:  Negative for chills, fatigue and fever.  Eyes:  Negative for pain and visual disturbance.  Respiratory:  Positive for chest tightness and shortness of breath. Negative for apnea and wheezing.   Cardiovascular:  Positive for palpitations. Negative for chest pain.  Gastrointestinal:  Positive for abdominal pain. Negative for constipation, diarrhea,  nausea and vomiting.  Endocrine: Positive for heat intolerance.  Genitourinary:  Negative for difficulty urinating, dysuria, pelvic pain, vaginal bleeding, vaginal discharge and vaginal pain.  Musculoskeletal:  Negative for back pain.  Skin:  Positive for wound.  Neurological:  Positive for headaches. Negative for seizures and weakness.  Psychiatric/Behavioral:  Negative for suicidal ideas.    Physical Exam   Blood pressure 120/70, pulse (!) 104, temperature (!) 97.5 F (36.4 C), temperature source Oral, resp. rate 16, height 5\' 4"  (1.626 m), weight 88.7 kg, last menstrual period 11/18/2021, SpO2 100 %, unknown if currently breastfeeding.  Physical Exam Vitals and nursing note reviewed.  Constitutional:      General: She is not in acute distress.    Appearance: Normal appearance.  HENT:     Head: Normocephalic.  Cardiovascular:     Rate and Rhythm: Normal rate and regular rhythm.  Pulmonary:     Effort: Pulmonary effort is normal. No respiratory distress.     Breath sounds: Normal breath sounds.     Comments: Respirations 13/min Abdominal:     Palpations: Abdomen is soft.     Tenderness: There is no abdominal tenderness.  Musculoskeletal:        General: Normal range of motion.     Cervical back: Normal range of motion.  Skin:    General: Skin is warm and dry.     Capillary Refill: Capillary refill takes less than 2 seconds.  Neurological:     Mental Status: She is alert and oriented to person, place, and time.  Psychiatric:        Mood and Affect: Mood normal.   FHT: 140bpm with moderate variability. Accels present no decels (appropriate for gestational age)  Toco: Occasional UI (patient unaware)    MAU Course  Procedures Orders Placed This Encounter  Procedures   Comprehensive metabolic panel   CBC   Protein / creatinine ratio, urine   Glucose, capillary   ED EKG    MDM - EKG reading NSR  - Finger stick 195, within range for Dexcom at 207.  - PreE labs  normal  - Normal Anion Gap and BiCarb low suspicion for DKA.  - @ 12:54 AM headache improved from a 7-->5/10.  - Benadryl and Reglan ordered.  - Consulted Dr. Kennon Rounds on DM management. Reviewed patient presentation and current clinical picture. Patient has follow up with Endocrinology in AM. Recommend that patient be placed back on Humalog, as she is not getting good coverage with the Basalar and Lymjev.  - Headache improved with PO Benadryl and Reglan.  - Plan for discharge  Assessment and Plan   1. Pregnancy headache in third trimester   2. [redacted] weeks gestation of pregnancy   3. Type 1 diabetes mellitus without complication (HCC)    - Reviewed symptoms likely related to spike in Glucose.  - Worsening signs and return precautions reviewed.  - Patient has F/u with Endo in AM, reccommended to keep appointment.  - FHT appropriate for gestational age upon time of discharge.  - Preterm labor precautions reviewed.  -  Patient discharged home in stable condition and may return to MAU as needed.   Jacquiline Doe, MSN CNM  07/19/2022, 11:02 PM

## 2022-07-20 ENCOUNTER — Ambulatory Visit: Payer: 59 | Admitting: Internal Medicine

## 2022-07-20 DIAGNOSIS — O26893 Other specified pregnancy related conditions, third trimester: Secondary | ICD-10-CM

## 2022-07-20 DIAGNOSIS — E109 Type 1 diabetes mellitus without complications: Secondary | ICD-10-CM

## 2022-07-20 DIAGNOSIS — R519 Headache, unspecified: Secondary | ICD-10-CM

## 2022-07-20 DIAGNOSIS — Z3A34 34 weeks gestation of pregnancy: Secondary | ICD-10-CM

## 2022-07-20 LAB — COMPREHENSIVE METABOLIC PANEL
ALT: 17 U/L (ref 0–44)
AST: 19 U/L (ref 15–41)
Albumin: 2.4 g/dL — ABNORMAL LOW (ref 3.5–5.0)
Alkaline Phosphatase: 87 U/L (ref 38–126)
Anion gap: 8 (ref 5–15)
BUN: 11 mg/dL (ref 6–20)
CO2: 21 mmol/L — ABNORMAL LOW (ref 22–32)
Calcium: 8.8 mg/dL — ABNORMAL LOW (ref 8.9–10.3)
Chloride: 103 mmol/L (ref 98–111)
Creatinine, Ser: 0.73 mg/dL (ref 0.44–1.00)
GFR, Estimated: >60 mL/min (ref >60)
Glucose, Bld: 197 mg/dL — ABNORMAL HIGH (ref 70–99)
Potassium: 3.5 mmol/L (ref 3.5–5.1)
Sodium: 132 mmol/L — ABNORMAL LOW (ref 135–145)
Total Bilirubin: 0.7 mg/dL (ref 0.3–1.2)
Total Protein: 5.9 g/dL — ABNORMAL LOW (ref 6.5–8.1)

## 2022-07-20 LAB — CBC
HCT: 30.7 % — ABNORMAL LOW (ref 36.0–46.0)
Hemoglobin: 10.3 g/dL — ABNORMAL LOW (ref 12.0–15.0)
MCH: 30.6 pg (ref 26.0–34.0)
MCHC: 33.6 g/dL (ref 30.0–36.0)
MCV: 91.1 fL (ref 80.0–100.0)
Platelets: 246 10*3/uL (ref 150–400)
RBC: 3.37 MIL/uL — ABNORMAL LOW (ref 3.87–5.11)
RDW: 13.3 % (ref 11.5–15.5)
WBC: 9.5 10*3/uL (ref 4.0–10.5)
nRBC: 0 % (ref 0.0–0.2)

## 2022-07-20 LAB — PROTEIN / CREATININE RATIO, URINE
Creatinine, Urine: 66 mg/dL
Protein Creatinine Ratio: 0.12 mg/mg{Cre} (ref 0.00–0.15)
Total Protein, Urine: 8 mg/dL

## 2022-07-20 MED ORDER — FAMOTIDINE 20 MG PO TABS
20.0000 mg | ORAL_TABLET | Freq: Once | ORAL | Status: AC
  Administered 2022-07-20: 20 mg via ORAL
  Filled 2022-07-20: qty 1

## 2022-07-20 MED ORDER — DIPHENHYDRAMINE HCL 50 MG/ML IJ SOLN: 12.5000 mg | Freq: Once | INTRAMUSCULAR | Status: DC

## 2022-07-20 MED ORDER — METOCLOPRAMIDE HCL 5 MG/ML IJ SOLN: 10.0000 mg | Freq: Once | INTRAMUSCULAR | Status: DC

## 2022-07-20 MED ORDER — METOCLOPRAMIDE HCL 10 MG PO TABS
10.0000 mg | ORAL_TABLET | Freq: Once | ORAL | Status: AC
  Administered 2022-07-20: 10 mg via ORAL
  Filled 2022-07-20: qty 1

## 2022-07-20 MED ORDER — DIPHENHYDRAMINE HCL 25 MG PO CAPS
25.0000 mg | ORAL_CAPSULE | Freq: Four times a day (QID) | ORAL | Status: DC | PRN
  Administered 2022-07-20: 25 mg via ORAL
  Filled 2022-07-20 (×2): qty 1

## 2022-07-21 ENCOUNTER — Encounter (HOSPITAL_COMMUNITY): Payer: Self-pay

## 2022-07-21 ENCOUNTER — Ambulatory Visit: Payer: 59 | Attending: Internal Medicine

## 2022-07-21 ENCOUNTER — Encounter: Payer: Self-pay | Admitting: Internal Medicine

## 2022-07-21 ENCOUNTER — Ambulatory Visit: Payer: 59 | Attending: Internal Medicine | Admitting: Internal Medicine

## 2022-07-21 VITALS — BP 110/70 | HR 83 | Ht 64.0 in | Wt 194.6 lb

## 2022-07-21 DIAGNOSIS — R002 Palpitations: Secondary | ICD-10-CM

## 2022-07-21 NOTE — Patient Instructions (Signed)
[place]  07/21/2022   Your procedure is scheduled on:  08/04/2022  Arrive at [facility] at Entrance C on [address] at [facility]  and [address]. You are invited to use the FREE valet parking or use the Visitor's parking deck.  Pick up the phone at the desk and dial [phone].  Call this number if you have problems the morning of surgery: [phone]  Remember:   Do not eat food:(After Midnight) Desps de medianoche.  Do not drink clear liquids: (After Midnight) Desps de medianoche.  Take these medicines the morning of surgery with A SIP OF WATER:  Bring your inhaler with you and your pump setting instructions   Do not wear jewelry, make-up or nail polish.  Do not wear lotions, powders, or perfumes. Do not wear deodorant.  Do not shave 48 hours prior to surgery.  Do not bring valuables to the hospital.  [facility] is not   responsible for any belongings or valuables brought to the hospital.  Contacts, dentures or bridgework may not be worn into surgery.  Leave suitcase in the car. After surgery it may be brought to your room.  For patients admitted to the hospital, checkout time is 11:00 AM the day of              discharge.      Please read over the following fact sheets that you were given:     Preparing for Surgery

## 2022-07-21 NOTE — Patient Instructions (Signed)
Medication Instructions:  No Changes In Medications at this time.  *If you need a refill on your cardiac medications before your next appointment, please call your pharmacy*  Lab Work: None Ordered At This Time.  If you have labs (blood work) drawn today and your tests are completely normal, you will receive your results only by: MyChart Message (if you have MyChart) OR A paper copy in the mail If you have any lab test that is abnormal or we need to change your treatment, we will call you to review the results.  Testing/Procedures:  ZIO XT- Long Term Monitor Instructions   Your physician has requested you wear your ZIO patch monitor___7____days.   This is a single patch monitor.  [employer] supplies one patch monitor per enrollment.  Additional stickers are not available.   Please do not apply patch if you will be having a Nuclear Stress Test, Echocardiogram, Cardiac CT, MRI, or Chest Xray during the time frame you would be wearing the monitor. The patch cannot be worn during these tests.  You cannot remove and re-apply the ZIO XT patch monitor.   Your ZIO patch monitor will be sent USPS Priority mail from [employer] directly to your home address. The monitor may also be mailed to a PO BOX if home delivery is not available.   It may take 3-5 days to receive your monitor after you have been enrolled.   Once you have received you monitor, please review enclosed instructions.  Your monitor has already been registered assigning a specific monitor serial # to you.   Applying the monitor   Shave hair from upper left chest.   Hold abrader disc by orange tab.  Rub abrader in 40 strokes over left upper chest as indicated in your monitor instructions.   Clean area with 4 enclosed alcohol pads .  Use all pads to assure are is cleaned thoroughly.  Let dry.   Apply patch as indicated in monitor instructions.  Patch will be place under collarbone on left side of chest with arrow pointing  upward.   Rub patch adhesive wings for 2 minutes.Remove white label marked "1".  Remove white label marked "2".  Rub patch adhesive wings for 2 additional minutes.   While looking in a mirror, press and release button in center of patch.  A small green light will flash 3-4 times .  This will be your only indicator the monitor has been turned on.     Do not shower for the first 24 hours.  You may shower after the first 24 hours.   Press button if you feel a symptom. You will hear a small click.  Record Date, Time and Symptom in the Patient Log Book.   When you are ready to remove patch, follow instructions on last 2 pages of Patient Log Book.  Stick patch monitor onto last page of Patient Log Book.   Place Patient Log Book in Blue box.  Use locking tab on box and tape box closed securely.  The Orange and White box has prepaid postage on it.  Please place in mailbox as soon as possible.  Your physician should have your test results approximately 7 days after the monitor has been mailed back to [employer].   Call [employer] Customer Care at [phone] if you have questions regarding your ZIO XT patch monitor.  Call them immediately if you see an orange light blinking on your monitor.   If your monitor falls off in less   than 4 days contact our Monitor department at [phone].  If your monitor becomes loose or falls off after 4 days call [employer] at [phone] for suggestions on securing your monitor.    Follow-Up: At [facility], you and your health needs are our priority.  As part of our continuing mission to provide you with exceptional heart care, we have created designated Provider Care Teams.  These Care Teams include your primary Cardiologist (physician) and Advanced Practice Providers (APPs -  Physician Assistants and Nurse Practitioners) who all work together to provide you with the care you need, when you need it.  Your next appointment:   [place]  Provider:   Dr. Johney Frame OR DR. Harriet Masson

## 2022-07-21 NOTE — Progress Notes (Unsigned)
Enrolled for [employer] to mail a ZIO XT long term holter monitor to the patients address on file.  

## 2022-07-21 NOTE — Progress Notes (Unsigned)
Cardiology Office Note:    Date:  07/21/2022   ID:  Ashley Hester, DOB 1990-09-02, MRN [id number]  PCP:  Kelton Pillar, MD   [facility] Providers Cardiologist:  None { Click to update primary MD,subspecialty MD or APP then REFRESH:1}    Referring MD: Charlotta Newton, MD   No chief complaint on file. ***  History of Present Illness:    Ashley Hester is a 32 y.o. female with a hx of type 1 DM, [redacted] weeks pregnant, she will nurse as well, referral for palpitations  Past Medical History:  Diagnosis Date   ADHD (attention deficit hyperactivity disorder)    Asthma    sport induced   Diabetes mellitus without complication (HCC)    Dyspnea    PONV (postoperative nausea and vomiting)    Hard to wake up   UTI (lower urinary tract infection)     Past Surgical History:  Procedure Laterality Date   APPENDECTOMY  [id number]   CESAREAN SECTION     LAPAROSCOPIC APPENDECTOMY  10/13/2011   Procedure: APPENDECTOMY LAPAROSCOPIC;  Surgeon: Rolm Bookbinder, MD;  Location: [facility];  Service: General;  Laterality: N/A;   LAPAROSCOPY  10/13/2011   Procedure: LAPAROSCOPY DIAGNOSTIC;  Surgeon: Rolm Bookbinder, MD;  Location: [facility];  Service: General;  Laterality: N/A;   wisdon teeth extraction      Current Medications: Current Meds  Medication Sig   aspirin EC 81 MG tablet Take 81 mg by mouth daily. Swallow whole.   ferrous sulfate 325 (65 FE) MG EC tablet Take 1 tablet every day by oral route.   HUMALOG 100 UNIT/ML injection SMARTSIG:0-50 Unit(s) SUB-Q   Insulin Human (INSULIN PUMP) SOLN Inject into the skin.   Prenatal Vit-Fe Fumarate-FA (PRENATAL MULTIVITAMIN) TABS tablet Take 1 tablet by mouth daily at 12 noon.     Allergies:   Patient has no known allergies.   Social History   Socioeconomic History   Marital status: Married    Spouse name: Not on file   Number of children: Not on file   Years of education: Not on file   Highest education level: Not on file   Occupational History   Not on file  Tobacco Use   Smoking status: Never   Smokeless tobacco: Never  Vaping Use   Vaping Use: Never used  Substance and Sexual Activity   Alcohol use: No   Drug use: No   Sexual activity: Not Currently  Other Topics Concern   Not on file  Social History Narrative   Not on file   Social Determinants of Health   Financial Resource Strain: Not on file  Food Insecurity: Not on file  Transportation Needs: Not on file  Physical Activity: Not on file  Stress: Not on file  Social Connections: Not on file     Family History: The patient's family history includes Cancer in her maternal grandmother, mother, and paternal uncle; Crohn's disease in her maternal grandmother; Diabetes in her paternal uncle; Hypertension in her mother. No SCD  ROS:   Please see the history of present illness.    *** All other systems reviewed and are negative.  EKGs/Labs/Other Studies Reviewed:    The following studies were reviewed today:   EKG:  EKG is  ordered today.  The ekg ordered today demonstrates   07/21/2022- NSR  Recent Labs: 07/19/2022: ALT 17; BUN 11; Creatinine, Ser 0.73; Hemoglobin 10.3; Platelets 246; Potassium 3.5; Sodium 132   Recent Lipid Panel  Component Value Date/Time   TRIG 724 (H) 12/08/2016 1610     Risk Assessment/Calculations:   {Does this patient have ATRIAL FIBRILLATION?:[phone]}            Physical Exam:    VS:  BP 110/70   Pulse 83   Ht 5\' 4"  (1.626 m)   Wt 194 lb 9.6 oz (88.3 kg)   LMP 11/18/2021   SpO2 97%   BMI 33.40 kg/m     Wt Readings from Last 3 Encounters:  07/21/22 194 lb 9.6 oz (88.3 kg)  07/19/22 195 lb 8 oz (88.7 kg)  06/21/22 188 lb 11.2 oz (85.6 kg)     GEN: *** Well nourished, well developed in no acute distress HEENT: Normal NECK: No JVD; No carotid bruits LYMPHATICS: No lymphadenopathy CARDIAC: ***RRR, no murmurs, rubs, gallops RESPIRATORY:  Clear to auscultation without rales, wheezing or  rhonchi  ABDOMEN: Soft, non-tender, non-distended MUSCULOSKELETAL:  No edema; No deformity  SKIN: Warm and dry NEUROLOGIC:  Alert and oriented x 3 PSYCHIATRIC:  Normal affect   ASSESSMENT:   Palpitations: 7 day ziopatch  PLAN:    In order of problems listed above:  7 days ziopatch Referral [facility] 1 month      {Are you ordering a CV Procedure (e.g. stress test, cath, DCCV, TEE, etc)?   Press F2        :[id number]}    Medication Adjustments/Labs and Tests Ordered: Current medicines are reviewed at length with the patient today.  Concerns regarding medicines are outlined above.  No orders of the defined types were placed in this encounter.  No orders of the defined types were placed in this encounter.   There are no Patient Instructions on file for this visit.   Signed, Janina Mayo, MD  07/21/2022 10:28 AM    [place]

## 2022-07-24 ENCOUNTER — Other Ambulatory Visit: Payer: Self-pay

## 2022-07-24 ENCOUNTER — Encounter (HOSPITAL_COMMUNITY): Payer: Self-pay | Admitting: Obstetrics and Gynecology

## 2022-07-24 ENCOUNTER — Inpatient Hospital Stay (HOSPITAL_COMMUNITY)
Admission: AD | Admit: 2022-07-24 | Discharge: 2022-08-07 | DRG: 788 | Disposition: A | Discharge status: Home / Self Care | Payer: 59 | Attending: Obstetrics & Gynecology | Admitting: Obstetrics & Gynecology

## 2022-07-24 DIAGNOSIS — O9902 Anemia complicating childbirth: Secondary | ICD-10-CM | POA: Diagnosis present

## 2022-07-24 DIAGNOSIS — E109 Type 1 diabetes mellitus without complications: Secondary | ICD-10-CM | POA: Diagnosis present

## 2022-07-24 DIAGNOSIS — O2402 Pre-existing diabetes mellitus, type 1, in childbirth: Principal | ICD-10-CM | POA: Diagnosis present

## 2022-07-24 DIAGNOSIS — O34219 Maternal care for unspecified type scar from previous cesarean delivery: Secondary | ICD-10-CM

## 2022-07-24 DIAGNOSIS — O24919 Unspecified diabetes mellitus in pregnancy, unspecified trimester: Secondary | ICD-10-CM | POA: Diagnosis present

## 2022-07-24 DIAGNOSIS — E1065 Type 1 diabetes mellitus with hyperglycemia: Secondary | ICD-10-CM | POA: Diagnosis present

## 2022-07-24 DIAGNOSIS — Z794 Long term (current) use of insulin: Secondary | ICD-10-CM

## 2022-07-24 DIAGNOSIS — Z3A35 35 weeks gestation of pregnancy: Secondary | ICD-10-CM

## 2022-07-24 DIAGNOSIS — O34211 Maternal care for low transverse scar from previous cesarean delivery: Secondary | ICD-10-CM | POA: Diagnosis present

## 2022-07-24 DIAGNOSIS — Z98891 History of uterine scar from previous surgery: Secondary | ICD-10-CM

## 2022-07-24 DIAGNOSIS — R7309 Other abnormal glucose: Secondary | ICD-10-CM | POA: Diagnosis present

## 2022-07-24 DIAGNOSIS — Z9641 Presence of insulin pump (external) (internal): Secondary | ICD-10-CM | POA: Diagnosis present

## 2022-07-24 LAB — CBC
HCT: 30.4 % — ABNORMAL LOW (ref 36.0–46.0)
Hemoglobin: 10.7 g/dL — ABNORMAL LOW (ref 12.0–15.0)
MCH: 31.1 pg (ref 26.0–34.0)
MCHC: 35.2 g/dL (ref 30.0–36.0)
MCV: 88.4 fL (ref 80.0–100.0)
Platelets: 216 10*3/uL (ref 150–400)
RBC: 3.44 MIL/uL — ABNORMAL LOW (ref 3.87–5.11)
RDW: 13.4 % (ref 11.5–15.5)
WBC: 7.5 10*3/uL (ref 4.0–10.5)
nRBC: 0 % (ref 0.0–0.2)

## 2022-07-24 LAB — COMPREHENSIVE METABOLIC PANEL
ALT: 29 U/L (ref 0–44)
AST: 24 U/L (ref 15–41)
Albumin: 2.3 g/dL — ABNORMAL LOW (ref 3.5–5.0)
Alkaline Phosphatase: 96 U/L (ref 38–126)
Anion gap: 9 (ref 5–15)
BUN: 7 mg/dL (ref 6–20)
CO2: 21 mmol/L — ABNORMAL LOW (ref 22–32)
Calcium: 9.2 mg/dL (ref 8.9–10.3)
Chloride: 105 mmol/L (ref 98–111)
Creatinine, Ser: 0.84 mg/dL (ref 0.44–1.00)
GFR, Estimated: >60 mL/min (ref >60)
Glucose, Bld: 95 mg/dL (ref 70–99)
Potassium: 3.3 mmol/L — ABNORMAL LOW (ref 3.5–5.1)
Sodium: 135 mmol/L (ref 135–145)
Total Bilirubin: <0.1 mg/dL — ABNORMAL LOW (ref 0.3–1.2)
Total Protein: 6 g/dL — ABNORMAL LOW (ref 6.5–8.1)

## 2022-07-24 LAB — LACTATE DEHYDROGENASE: LDH: 121 U/L (ref 98–192)

## 2022-07-24 LAB — PROTEIN / CREATININE RATIO, URINE
Creatinine, Urine: 69 mg/dL
Protein Creatinine Ratio: 0.19 mg/mg{Cre} — ABNORMAL HIGH (ref 0.00–0.15)
Total Protein, Urine: 13 mg/dL

## 2022-07-24 LAB — URIC ACID: Uric Acid, Serum: 4.1 mg/dL (ref 2.5–7.1)

## 2022-07-24 LAB — TYPE AND SCREEN
ABO/RH(D): B POS
Antibody Screen: NEGATIVE

## 2022-07-24 LAB — GLUCOSE, CAPILLARY
Glucose-Capillary: 83 mg/dL (ref 70–99)
Glucose-Capillary: 143 mg/dL — ABNORMAL HIGH (ref 70–99)

## 2022-07-24 MED ORDER — DEXTROSE 50 % IV SOLN
0.0000 mL | INTRAVENOUS | Status: DC | PRN
  Administered 2022-08-04: 25 mL via INTRAVENOUS
  Filled 2022-07-24: qty 50

## 2022-07-24 MED ORDER — INSULIN REGULAR(HUMAN) IN NACL 100-0.9 UT/100ML-% IV SOLN
INTRAVENOUS | Status: AC
  Administered 2022-07-24: 3.6 [IU]/h via INTRAVENOUS
  Filled 2022-07-24: qty 100

## 2022-07-24 MED ORDER — DEXTROSE IN LACTATED RINGERS 5 % IV SOLN: INTRAVENOUS | Status: DC

## 2022-07-24 MED ORDER — CALCIUM CARBONATE ANTACID 500 MG PO CHEW
2.0000 | CHEWABLE_TABLET | ORAL | Status: DC | PRN
  Administered 2022-07-25 – 2022-07-28 (×5): 400 mg via ORAL
  Filled 2022-07-24 (×5): qty 2

## 2022-07-24 MED ORDER — PRENATAL MULTIVITAMIN CH
1.0000 | ORAL_TABLET | Freq: Every day | ORAL | Status: DC
  Administered 2022-07-25 – 2022-08-03 (×10): 1 via ORAL
  Filled 2022-07-24 (×10): qty 1

## 2022-07-24 MED ORDER — ACETAMINOPHEN 325 MG PO TABS
650.0000 mg | ORAL_TABLET | ORAL | Status: DC | PRN
  Administered 2022-07-25 – 2022-08-01 (×7): 650 mg via ORAL
  Filled 2022-07-24 (×7): qty 2

## 2022-07-24 MED ORDER — DOCUSATE SODIUM 100 MG PO CAPS
100.0000 mg | ORAL_CAPSULE | Freq: Every day | ORAL | Status: DC
  Administered 2022-07-25 – 2022-08-03 (×10): 100 mg via ORAL
  Filled 2022-07-24 (×10): qty 1

## 2022-07-24 MED ORDER — LACTATED RINGERS IV SOLN: 125.0000 mL/h | INTRAVENOUS | Status: AC

## 2022-07-24 MED ORDER — LACTATED RINGERS IV SOLN: INTRAVENOUS | Status: DC

## 2022-07-24 NOTE — H&P (Addendum)
Antepartum History and Physical   Ashley Hester is a 32 y.o. female [phone] that presented for management of poorly controlled T1DM at [redacted]w[redacted]d  She has insulin pump in place and despite frequent adjustments and follow up with endocrinology outpatient, she has had increasingly labile blood glucose.  At night, she often has low values in the 40s and day-time can run as high as 200s.  She has a significant history of DKA with her prior pregnancy that require intubation, ICU care, and delivery via C section at 35 weeks. During this time she also had working diagnosis of atypical preeclampsia that was notable for elevated LFTs.   She has undergone antenatal testing and was seen as recent as today, with reactive NST and BPP 10/10.  Given her trouble with glucoregulation, she was advised to have inpatient monitoring and adjustment of insulin.   She reports good fetal movement, denies bleeding, LOF, or contractions.   Most recent growth ultrasound was 07/14/22 at [facility], EFW was 91%ile.  Earlier in pregnancy, fetal echo was done that showed small pericardial effusion, which resolved.   OB History     Gravida  3   Para  1   Term  0   Preterm  1   AB  1   Living  1      SAB  1   IAB  0   Ectopic  0   Multiple  0   Live Births             Past Medical History:  Diagnosis Date   ADHD (attention deficit hyperactivity disorder)    Asthma    sport induced   Diabetes mellitus without complication (HCC)    Dyspnea    PONV (postoperative nausea and vomiting)    Hard to wake up   UTI (lower urinary tract infection)    Past Surgical History:  Procedure Laterality Date   APPENDECTOMY  [id number]  CESAREAN SECTION     LAPAROSCOPIC APPENDECTOMY  10/13/2011   Procedure: APPENDECTOMY LAPAROSCOPIC;  Surgeon: MRolm Bookbinder MD;  Location: [facility];  Service: General;  Laterality: N/A;   LAPAROSCOPY  10/13/2011   Procedure: LAPAROSCOPY DIAGNOSTIC;  Surgeon: MRolm Bookbinder MD;   Location: [facility];  Service: General;  Laterality: N/A;   wisdon teeth extraction     Family History: family history includes Cancer in her maternal grandmother, mother, and paternal uncle; Crohn's disease in her maternal grandmother; Diabetes in her paternal uncle; Hypertension in her mother. Social History:  reports that she has never smoked. She has never used smokeless tobacco. She reports that she does not drink alcohol and does not use drugs.     Maternal Diabetes: T1DM Genetic Screening: Normal Maternal Ultrasounds/Referrals:  Fetal Ultrasounds or other Referrals:  Referred to Materal Fetal Medicine  Maternal Substance Abuse:  No Significant Maternal Medications:  Insulin pump Significant Maternal Lab Results:  hyperglycemia Other Comments:    Review of Systems History   Blood pressure 120/67, pulse 97, temperature 98.8 F (37.1 C), temperature source Oral, resp. rate 17, last menstrual period 11/18/2021, SpO2 97 %, unknown if currently breastfeeding. Exam Physical Exam   Gen: alert, well appearing, no distress Chest: nonlabored breathing CV: no peripheral edema Abdomen: soft, nontender Ext: no evidence of DVT  Prenatal labs: ABO, Rh: B/Positive/-- (08/22 0000) Antibody: Negative (08/22 0000) Rubella: Immune (08/22 0000) RPR: Nonreactive (08/22 0000)  HBsAg: Negative (08/22 0000)  HIV: Non-reactive (08/22 0000)  GBS: Negative/-- (08/22 0000)   Assessment/Plan:  Admit to [facility] for insulin management Will discontinue pump and trial IV insulin w/ endotool Appreciate DM care recs HELLP labs wnl.  P/C ratio pending Hx of prior C section. Plan for repeat, scheduled at 37 weeks on 08/04/22 NST q shift BMZ deferred given glucoregulation concerns Diet: DM diet DVT Ppx: SCDs Discussed plan with MFM    Carlyon Shadow 07/24/2022, 5:41 PM

## 2022-07-25 DIAGNOSIS — O24919 Unspecified diabetes mellitus in pregnancy, unspecified trimester: Principal | ICD-10-CM | POA: Diagnosis present

## 2022-07-25 LAB — GLUCOSE, CAPILLARY
Glucose-Capillary: 103 mg/dL — ABNORMAL HIGH (ref 70–99)
Glucose-Capillary: 128 mg/dL — ABNORMAL HIGH (ref 70–99)
Glucose-Capillary: 130 mg/dL — ABNORMAL HIGH (ref 70–99)
Glucose-Capillary: 137 mg/dL — ABNORMAL HIGH (ref 70–99)
Glucose-Capillary: 156 mg/dL — ABNORMAL HIGH (ref 70–99)
Glucose-Capillary: 71 mg/dL (ref 70–99)
Glucose-Capillary: 73 mg/dL (ref 70–99)
Glucose-Capillary: 75 mg/dL (ref 70–99)
Glucose-Capillary: 78 mg/dL (ref 70–99)
Glucose-Capillary: 90 mg/dL (ref 70–99)

## 2022-07-25 MED ORDER — LACTATED RINGERS IV SOLN: Freq: Once | INTRAVENOUS | Status: AC

## 2022-07-25 MED ORDER — DIPHENHYDRAMINE HCL 50 MG/ML IJ SOLN
12.5000 mg | Freq: Once | INTRAMUSCULAR | Status: AC
  Administered 2022-07-25: 12.5 mg via INTRAVENOUS
  Filled 2022-07-25: qty 1

## 2022-07-25 MED ORDER — INSULIN PUMP
SUBCUTANEOUS | Status: DC
  Administered 2022-07-25: 12.56 via SUBCUTANEOUS
  Administered 2022-07-26: 15.34 via SUBCUTANEOUS
  Administered 2022-07-26: 2.2 via SUBCUTANEOUS
  Administered 2022-07-26: 10.27 via SUBCUTANEOUS
  Administered 2022-07-26: 13.49 via SUBCUTANEOUS
  Administered 2022-07-29: 18 via SUBCUTANEOUS
  Administered 2022-07-29: 15.2 via SUBCUTANEOUS
  Administered 2022-07-30: 13.2 via SUBCUTANEOUS
  Administered 2022-07-30: 6.9 via SUBCUTANEOUS
  Administered 2022-07-31: 13 via SUBCUTANEOUS
  Administered 2022-07-31: 11.4 via SUBCUTANEOUS
  Administered 2022-08-01: 22 via SUBCUTANEOUS
  Administered 2022-08-01: 10 via SUBCUTANEOUS
  Administered 2022-08-02: 19 via SUBCUTANEOUS
  Administered 2022-08-02: 9.8 via SUBCUTANEOUS
  Filled 2022-07-25: qty 1

## 2022-07-25 MED ORDER — METOCLOPRAMIDE HCL 5 MG/ML IJ SOLN
10.0000 mg | Freq: Once | INTRAMUSCULAR | Status: AC
  Administered 2022-07-25: 10 mg via INTRAVENOUS
  Filled 2022-07-25: qty 2

## 2022-07-25 NOTE — Progress Notes (Signed)
Antepartum Progress Note   Ashley Hester is a 32 y.o. female [phone] that is admitted to [place] for poorly controlled T1DM.  Overnight, she reports no acute events.  Admits fetal movement, denies contractions, denies leakage of fluid, denies vaginal bleeding.  History   Blood pressure (!) 104/51, pulse 72, temperature 98.1 F (36.7 C), temperature source Oral, resp. rate 16, height 5' 4"$  (1.626 m), weight 88.4 kg, last menstrual period 11/18/2021, SpO2 99 %, unknown if currently breastfeeding. Exam  Physical Exam: Gen: Alert, well appearing, no distress Chest: nonlabored breathing CV: no peripheral edema Abdomen: gravid, soft and nontender Ext: no evidence of DVT  FHT: reactive and reassuring, toco with irritability and contractions not felt by patient  Assessment/Plan: Admitted to [facility] for glucoregulation T1DM Insulin pump off and currently on IV insulin BG improved from home values, but sugars remain poorly controlled. Will continue IV insulin at this time, appreciate DM coordinator recs History of atypical preeclampsia.  HELLP labs wnl, BP reassuring, P/C ratio 0.19 EFM and toco TID NST Diet: DM pregnancy diet DVT Ppx: SCDs    Carlyon Shadow 07/25/2022, 7:03 AM

## 2022-07-25 NOTE — Plan of Care (Signed)
  Problem: Education: Goal: Knowledge of General Education information will improve Description: Including pain rating scale, medication(s)/side effects and non-pharmacologic comfort measures Outcome: Progressing   Problem: Health Behavior/Discharge Planning: Goal: Ability to manage health-related needs will improve Outcome: Progressing   Problem: Clinical Measurements: Goal: Ability to maintain clinical measurements within normal limits will improve Outcome: Progressing Goal: Will remain free from infection Outcome: Progressing Goal: Diagnostic test results will improve Outcome: Progressing Goal: Respiratory complications will improve Outcome: Progressing Goal: Cardiovascular complication will be avoided Outcome: Progressing   Problem: Activity: Goal: Risk for activity intolerance will decrease Outcome: Progressing   Problem: Nutrition: Goal: Adequate nutrition will be maintained Outcome: Progressing   Problem: Coping: Goal: Level of anxiety will decrease Outcome: Progressing   Problem: Elimination: Goal: Will not experience complications related to bowel motility Outcome: Progressing Goal: Will not experience complications related to urinary retention Outcome: Progressing   Problem: Pain Managment: Goal: General experience of comfort will improve Outcome: Progressing   Problem: Safety: Goal: Ability to remain free from injury will improve Outcome: Progressing   Problem: Skin Integrity: Goal: Risk for impaired skin integrity will decrease Outcome: Progressing   Problem: Education: Goal: Knowledge of disease or condition will improve Outcome: Progressing Goal: Knowledge of the prescribed therapeutic regimen will improve Outcome: Progressing Goal: Individualized Educational Video(s) Outcome: Progressing   Problem: Clinical Measurements: Goal: Complications related to the disease process, condition or treatment will be avoided or minimized Outcome:  Progressing   Problem: Education: Goal: Ability to describe self-care measures that may prevent or decrease complications (Diabetes Survival Skills Education) will improve Outcome: Progressing Goal: Individualized Educational Video(s) Outcome: Progressing   Problem: Coping: Goal: Ability to adjust to condition or change in health will improve Outcome: Progressing   Problem: Fluid Volume: Goal: Ability to maintain a balanced intake and output will improve Outcome: Progressing   Problem: Health Behavior/Discharge Planning: Goal: Ability to identify and utilize available resources and services will improve Outcome: Progressing Goal: Ability to manage health-related needs will improve Outcome: Progressing   Problem: Metabolic: Goal: Ability to maintain appropriate glucose levels will improve Outcome: Progressing   Problem: Nutritional: Goal: Maintenance of adequate nutrition will improve Outcome: Progressing Goal: Progress toward achieving an optimal weight will improve Outcome: Progressing   Problem: Skin Integrity: Goal: Risk for impaired skin integrity will decrease Outcome: Progressing   Problem: Tissue Perfusion: Goal: Adequacy of tissue perfusion will improve Outcome: Progressing

## 2022-07-25 NOTE — Progress Notes (Signed)
At bedside to check on patient.  She had previously reported contractions and was placed on the monitor.  FHT reassuring but toco with uterine ctx intermittently and irritability.  Tylenol given and IVF begun. Her contractions are improving.  She also reports moderate frontal headache, may be due to stress with contractions, sleep disturbance.  She denies vision change.  BP 118/64 (BP Location: Left Arm)   Pulse 91   Temp 98.5 F (36.9 C) (Oral)   Resp 17   Ht 5' 4"$  (1.626 m)   Wt 88.4 kg   LMP 11/18/2021   SpO2 100%   BMI 33.44 kg/m   On exam patient well appearing, abdomen soft, gravid.    Continue fluid bolus, will trial benadryl/reglan for headache and sleep.  Plan for CMP in AM   Of note, blood sugar control has been improved throughout the day after transitioning to new pump settings.   Will watch closely, discontinue FHT if contractions do not return   Alpha Gula

## 2022-07-25 NOTE — Progress Notes (Signed)
Inpatient Diabetes Program Recommendations  ADA Standards of Care 2023 Diabetes in Pregnancy Target Glucose Ranges:  Fasting: 70 - 95 mg/dL 1 hr postprandial:  110 - 150m/dL (from first bite of meal) 2 hr postprandial:  100 - 120 mg/dL (from first bit of meal)    Lab Results  Component Value Date   GLUCAP 71 07/25/2022    Review of Glycemic Control  Latest Reference Range & Units 07/24/22 23:20 07/25/22 04:36 07/25/22 10:47 07/25/22 11:46  Glucose-Capillary 70 - 99 mg/dL 143 (H) 90 156 (H) 71   Diabetes history: Type 1 DM Outpatient Diabetes medications:  T Slim insulin pump Current orders for Inpatient glycemic control:  IV insulin -90-120 mg/dL  Inpatient Diabetes Program Recommendations:    Note patient admitted with labile blood sugars including lows overnight and highs during the day.   Currently on IV insulin and blood sugars are higher than usual.  Spoke at length with patient and Dr. GMardelle Matte  Order received to restart insulin pump with adjustment in settings to prevent lows over night.    Pump settings are: (BOLD INDICATES CHANGES)  Times:           Basal Rate         Correction Factor      Insulin to CHO ratio      Goal blood sugar      12A-6A            2.8 units/hr  11                               2.7                               85 6A-6P              3.0 units/hr             11   2.7   80 6P-10P            3.0 units/hr  15    5               80 10P-12A          2.8 units/hr  15    5   80   Spoke with patient by facetime and verified setting changes on her pump.  Blood sugar dropped to 59 mg/dL on insulin drip and she was in process of eating/drinking to correct low.  Plan is to restart insulin pump with new settings (and CGM-DEXCOM) and monitor every 2 hours.  Patient also reset her low alarm to 85 mg/dL so she is alerted when dropping. IV insulin and insulin pump will overlap by 1 hour.  Discussed at length with patient, family and RN as well.  Will follow  closely.  Also MD plans to recheck BMP in the AM as well.   Thanks,  JAdah Perl RN, BC-ADM Inpatient Diabetes Coordinator Pager [phone] (8a-5p)

## 2022-07-26 LAB — GLUCOSE, CAPILLARY
Glucose-Capillary: 79 mg/dL (ref 70–99)
Glucose-Capillary: 124 mg/dL — ABNORMAL HIGH (ref 70–99)
Glucose-Capillary: 73 mg/dL (ref 70–99)
Glucose-Capillary: 74 mg/dL (ref 70–99)
Glucose-Capillary: 128 mg/dL — ABNORMAL HIGH (ref 70–99)
Glucose-Capillary: 102 mg/dL — ABNORMAL HIGH (ref 70–99)
Glucose-Capillary: 148 mg/dL — ABNORMAL HIGH (ref 70–99)
Glucose-Capillary: 55 mg/dL — ABNORMAL LOW (ref 70–99)
Glucose-Capillary: 76 mg/dL (ref 70–99)
Glucose-Capillary: 155 mg/dL — ABNORMAL HIGH (ref 70–99)
Glucose-Capillary: 143 mg/dL — ABNORMAL HIGH (ref 70–99)

## 2022-07-26 LAB — BASIC METABOLIC PANEL
Anion gap: 10 (ref 5–15)
BUN: 5 mg/dL — ABNORMAL LOW (ref 6–20)
CO2: 22 mmol/L (ref 22–32)
Calcium: 8.8 mg/dL — ABNORMAL LOW (ref 8.9–10.3)
Chloride: 106 mmol/L (ref 98–111)
Creatinine, Ser: 0.86 mg/dL (ref 0.44–1.00)
GFR, Estimated: >60 mL/min (ref >60)
Glucose, Bld: 97 mg/dL (ref 70–99)
Potassium: 3.5 mmol/L (ref 3.5–5.1)
Sodium: 138 mmol/L (ref 135–145)

## 2022-07-26 LAB — HEPATIC FUNCTION PANEL
ALT: 24 U/L (ref 0–44)
AST: 21 U/L (ref 15–41)
Albumin: 2 g/dL — ABNORMAL LOW (ref 3.5–5.0)
Alkaline Phosphatase: 91 U/L (ref 38–126)
Bilirubin, Direct: <0.1 mg/dL (ref 0.0–0.2)
Total Bilirubin: 0.3 mg/dL (ref 0.3–1.2)
Total Protein: 5.3 g/dL — ABNORMAL LOW (ref 6.5–8.1)

## 2022-07-26 MED ORDER — NIFEDIPINE 10 MG PO CAPS
10.0000 mg | ORAL_CAPSULE | Freq: Once | ORAL | Status: AC
  Administered 2022-08-01: 10 mg via ORAL

## 2022-07-26 MED ORDER — LACTATED RINGERS IV BOLUS
500.0000 mL | Freq: Once | INTRAVENOUS | Status: AC
  Administered 2022-07-26: 500 mL via INTRAVENOUS

## 2022-07-26 MED ORDER — BUTALBITAL-APAP-CAFFEINE 50-325-40 MG PO TABS
1.0000 | ORAL_TABLET | Freq: Once | ORAL | Status: AC
  Administered 2022-07-26: 1 via ORAL
  Filled 2022-07-26: qty 1

## 2022-07-26 NOTE — Progress Notes (Signed)
Antepartum Progress Note   Ashley Hester is a 32 y.o. female [phone] that is admitted to [facility] for poorly controlled T1DM, today she is [redacted]w[redacted]d  Yesterday evening she had contractions that resolved with Overnight, she reports no acute events.  Admits fetal movement, denies contractions, denies leakage of fluid, denies vaginal bleeding.  History   Blood pressure (!) 105/53, pulse 87, temperature 98 F (36.7 C), temperature source Oral, resp. rate 17, height 5' 4"$  (1.626 m), weight 88.4 kg, last menstrual period 11/18/2021, SpO2 97 %, unknown if currently breastfeeding. Exam  Physical Exam: Gen: Alert, well appearing, no distress Chest: nonlabored breathing CV: no peripheral edema Abdomen: gravid, soft and nontender Ext: no evidence of DVT  FHT: reactive and reassuring, toco with irritability and contractions not felt by patient  [facility]: [facility] Insulin pump with revised settings.  Appreciate DM coordinator involvement BG is w/ improved control.  Some low values in 70s overnight but resolved with juice History of atypical preeclampsia.  HELLP labs wnl, BP reassuring, P/C ratio 0.19 Has had a headache improved with benadryl/reglan yesterday. Mild.  No vision changes. Will trial fioricet today.  Check LFTs. Ctx yesterday resolved with IV fluids. EFM and toco TID NST has been reassuring. Will change to daily NST or as indicated.  Diet: DM pregnancy diet DVT Ppx: SCDs Dispo: will favor continued conservative management with inpatient monitoring of BG given her significant history of DKA with last pregnancy.  Patient feels more comfortable with this as well.     TCarlyon Shadow2/04/2023, 7:17 AM

## 2022-07-26 NOTE — Progress Notes (Signed)
At bedside to check on patient for onset of contractions.  CTX irregular but stronger.  She has not had cervical check since early third trimester.  Cervix 1, thick, high.  FHT reactive and reassuring. 120/moderate/pos accels/no decels  Reassurance given, will trial fluid bolus and if ctx persist, consider procardia.   Alpha Gula MD

## 2022-07-26 NOTE — Plan of Care (Signed)
  Problem: Education: Goal: Knowledge of General Education information will improve Description: Including pain rating scale, medication(s)/side effects and non-pharmacologic comfort measures Outcome: Progressing   Problem: Health Behavior/Discharge Planning: Goal: Ability to manage health-related needs will improve Outcome: Progressing   Problem: Clinical Measurements: Goal: Ability to maintain clinical measurements within normal limits will improve Outcome: Progressing Goal: Will remain free from infection Outcome: Progressing Goal: Diagnostic test results will improve Outcome: Progressing Goal: Respiratory complications will improve Outcome: Progressing Goal: Cardiovascular complication will be avoided Outcome: Progressing   Problem: Activity: Goal: Risk for activity intolerance will decrease Outcome: Progressing   Problem: Nutrition: Goal: Adequate nutrition will be maintained Outcome: Progressing   Problem: Coping: Goal: Level of anxiety will decrease Outcome: Progressing   Problem: Elimination: Goal: Will not experience complications related to bowel motility Outcome: Progressing Goal: Will not experience complications related to urinary retention Outcome: Progressing   Problem: Pain Managment: Goal: General experience of comfort will improve Outcome: Progressing   Problem: Safety: Goal: Ability to remain free from injury will improve Outcome: Progressing   Problem: Skin Integrity: Goal: Risk for impaired skin integrity will decrease Outcome: Progressing   Problem: Education: Goal: Knowledge of disease or condition will improve Outcome: Progressing Goal: Knowledge of the prescribed therapeutic regimen will improve Outcome: Progressing Goal: Individualized Educational Video(s) Outcome: Progressing   Problem: Clinical Measurements: Goal: Complications related to the disease process, condition or treatment will be avoided or minimized Outcome:  Progressing   Problem: Education: Goal: Ability to describe self-care measures that may prevent or decrease complications (Diabetes Survival Skills Education) will improve Outcome: Progressing Goal: Individualized Educational Video(s) Outcome: Progressing   Problem: Coping: Goal: Ability to adjust to condition or change in health will improve Outcome: Progressing   Problem: Fluid Volume: Goal: Ability to maintain a balanced intake and output will improve Outcome: Progressing   Problem: Health Behavior/Discharge Planning: Goal: Ability to identify and utilize available resources and services will improve Outcome: Progressing Goal: Ability to manage health-related needs will improve Outcome: Progressing   Problem: Metabolic: Goal: Ability to maintain appropriate glucose levels will improve Outcome: Progressing   Problem: Nutritional: Goal: Maintenance of adequate nutrition will improve Outcome: Progressing Goal: Progress toward achieving an optimal weight will improve Outcome: Progressing   Problem: Skin Integrity: Goal: Risk for impaired skin integrity will decrease Outcome: Progressing   Problem: Tissue Perfusion: Goal: Adequacy of tissue perfusion will improve Outcome: Progressing   

## 2022-07-26 NOTE — Inpatient Diabetes Management (Addendum)
Inpatient Diabetes Program Recommendations  ADA Standards of Care 2023 Diabetes in Pregnancy Target Glucose Ranges:  Fasting: 70 - 95 mg/dL 1 hr postprandial:  110 - 161m/dL (from first bite of meal) 2 hr postprandial:  100 - 120 mg/dL (from first bit of meal)    Lab Results  Component Value Date   GLUCAP 74 07/26/2022    Review of Glycemic Control  Latest Reference Range & Units 07/25/22 23:34 07/26/22 01:25 07/26/22 03:31 07/26/22 05:38 07/26/22 08:05  Glucose-Capillary 70 - 99 mg/dL 75 79 124 (H) 73 74  Diabetes history: Type 1 DM Outpatient Diabetes medications:  T Slim insulin pump Current orders for Inpatient glycemic control:  Insulin pump restarted on 2/10 with the following settings: Pump settings are: (BOLD INDICATES CHANGES)   Times:           Basal Rate         Correction Factor      Insulin to CHO ratio      Goal blood sugar      12A-6A            2.8 units/hr                11                               2.7                               85 6A-6P              3.0 units/hr                  11                                2.7                               80 6P-10P            3.0 units/hr                  15                                 5                                 80 10P-12A          2.8 units/hr                15                                 5                                 80   Inpatient Diabetes Program Recommendations:    Blood sugars improved with no significant lows overnight.  Per patient she is noticing increased blood sugar after eating breakfast (as high as 144)- however it is coming back down.  No changes recommended to insulin pump today.   Thanks,  Adah Perl, RN, BC-ADM Inpatient Diabetes Coordinator Pager [phone]  (8a-5p)

## 2022-07-26 NOTE — Progress Notes (Signed)
Hypoglycemic Event  CBG: 60  Treatment: 4 oz juice/soda  Symptoms: None  Follow-up CBG: Time:1535 CBG Result:72  Possible Reasons for Event: Unknown  Comments/MD notified: Dr Mardelle Matte notified     Ashley Hester

## 2022-07-27 DIAGNOSIS — O34211 Maternal care for low transverse scar from previous cesarean delivery: Secondary | ICD-10-CM | POA: Diagnosis present

## 2022-07-27 DIAGNOSIS — Z3A37 37 weeks gestation of pregnancy: Secondary | ICD-10-CM | POA: Diagnosis not present

## 2022-07-27 DIAGNOSIS — O9902 Anemia complicating childbirth: Secondary | ICD-10-CM | POA: Diagnosis present

## 2022-07-27 DIAGNOSIS — O2402 Pre-existing diabetes mellitus, type 1, in childbirth: Secondary | ICD-10-CM | POA: Diagnosis present

## 2022-07-27 DIAGNOSIS — R7309 Other abnormal glucose: Secondary | ICD-10-CM | POA: Diagnosis present

## 2022-07-27 DIAGNOSIS — Z794 Long term (current) use of insulin: Secondary | ICD-10-CM | POA: Diagnosis not present

## 2022-07-27 DIAGNOSIS — Z3A35 35 weeks gestation of pregnancy: Secondary | ICD-10-CM | POA: Diagnosis not present

## 2022-07-27 DIAGNOSIS — E1065 Type 1 diabetes mellitus with hyperglycemia: Secondary | ICD-10-CM | POA: Diagnosis present

## 2022-07-27 DIAGNOSIS — Z9641 Presence of insulin pump (external) (internal): Secondary | ICD-10-CM | POA: Diagnosis present

## 2022-07-27 DIAGNOSIS — E109 Type 1 diabetes mellitus without complications: Secondary | ICD-10-CM | POA: Diagnosis present

## 2022-07-27 LAB — GLUCOSE, CAPILLARY
Glucose-Capillary: 100 mg/dL — ABNORMAL HIGH (ref 70–99)
Glucose-Capillary: 118 mg/dL — ABNORMAL HIGH (ref 70–99)
Glucose-Capillary: 120 mg/dL — ABNORMAL HIGH (ref 70–99)
Glucose-Capillary: 89 mg/dL (ref 70–99)
Glucose-Capillary: 94 mg/dL (ref 70–99)
Glucose-Capillary: 96 mg/dL (ref 70–99)

## 2022-07-27 LAB — TYPE AND SCREEN
ABO/RH(D): B POS
Antibody Screen: NEGATIVE

## 2022-07-27 MED ORDER — ONDANSETRON 4 MG PO TBDP
4.0000 mg | ORAL_TABLET | Freq: Three times a day (TID) | ORAL | Status: DC | PRN
  Administered 2022-07-27 – 2022-07-28 (×2): 4 mg via ORAL
  Filled 2022-07-27 (×2): qty 1

## 2022-07-27 NOTE — Progress Notes (Addendum)
Inpatient Diabetes Program Recommendations  ADA Standards of Care 2023 Diabetes in Pregnancy Target Glucose Ranges:  Fasting: 70 - 95 mg/dL 1 hr postprandial:  110 - 145m/dL (from first bite of meal) 2 hr postprandial:  100 - 120 mg/dL (from first bit of meal)    Lab Results  Component Value Date   GLUCAP 118 (H) 07/27/2022    Latest Reference Range & Units 07/26/22 11:28 07/26/22 13:55 07/26/22 17:42 07/26/22 18:08  Glucose-Capillary 70 - 99 mg/dL 102 (H) 148 (H) 55 (L) 76  (H): Data is abnormally high (L): Data is abnormally low Review of Glycemic Control  Latest Reference Range & Units 07/27/22 02:02 07/27/22 04:26 07/27/22 06:10 07/27/22 08:05 07/27/22 10:00  Glucose-Capillary 70 - 99 mg/dL 89 94 96 100 (H) 118 (H)  Diabetes history: Type 1 DM Outpatient Diabetes medications:  T Slim insulin pump Current orders for Inpatient glycemic control:  Insulin pump restarted on 2/10 with the following settings: Pump settings are: (BOLD INDICATES CHANGES)   Times:           Basal Rate         Correction Factor      Insulin to CHO ratio      Goal blood sugar      12A-6A            2.8 units/hr                11                               2.7                               85 6A-6P              3.0 units/hr                 11                                2.7                               80 6P-10P            3.0 units/hr                 15                                 5                                 80 10P-12A          2.8 units/hr                 15                                 5                                 80  Inpatient Diabetes Program Recommendations:   Visited with patient and she is doing well.  Still having some lows yesterday. Patient added new CHO ratio and correction factor per MD order.  Also changed CBG's to q 8 hours- and continue to document CGM readings q 2 hours per MD order.    Times:            Basal Rate         Correction Factor      Insulin to CHO  ratio      Goal blood sugar  12 noon-6p     3.0 units/hr                15                                  5                                  80  We discussed post-delivery plan as well. She states that her endocrinologist told her to wear pump until going into OR for c-section.  She will then take off and restart at pre-pregnancy rates/settings immediately after delivery.  Encouraged patient to go ahead and put in the post delivery settings as another basal rate/etc so that she can quickly restart after delivery.  Patient and husband both verbalize understanding.   Thanks,  Adah Perl, RN, BC-ADM Inpatient Diabetes Coordinator Pager [phone]  (8a-5p)

## 2022-07-27 NOTE — Progress Notes (Signed)
Antepartum Progress Note   Ashley Hester is a 32 y.o. female [phone] that is admitted to [facility] for poorly controlled T1DM, today she is [redacted]w[redacted]d  Contractions completely resolved outside of typical Bhs. She has good FM and denies LOF and VB.  Would like to transition to checking BS without finger sticks on her own dexcom. Highest value in the last few days has been 160. Some mild lows early on in admission - but none severely symptomatic. Last 24 hours lowest in 80s. Highest 155. Denies HA, f/c, etc.   History Dilation: 1 Effacement (%): Thick Exam by:: GMardelle Matte MD Blood pressure 118/65, pulse 81, temperature 98 F (36.7 C), temperature source Oral, resp. rate 17, height 5' 4"$  (1.626 m), weight 88.4 kg, last menstrual period 11/18/2021, SpO2 98 %, unknown if currently breastfeeding. Exam  Physical Exam: Gen: Alert, well appearing, no distress Chest: nonlabored breathing CV: no peripheral edema Abdomen: gravid, soft and nontender Ext: no evidence of DVT  FHT: reactive and reassuring, toco with irritability and contractions not felt by patient  Assessment/Plan: [facility]DM Insulin pump with revised settings.  Appreciate DM coordinator involvement BG is w/greatly improved control. Low values improving Will confer with coordinator to see if switching to patient's monitor is appropriate.  History of atypical preeclampsia.  HELLP labs wnl, BP reassuring, P/C ratio 0.19. No current s/s PIH.  Ctx yesterday resolved with IV fluid - has procardia ordered prn. Has not needed.  EFM and toco TID NST has been reassuring. Will change to daily NST or as indicated.  Diet: DM pregnancy diet DVT Ppx: SCDs Dispo: will favor continued conservative management with inpatient monitoring of BG given her significant history of DKA with last pregnancy.  Patient feels more comfortable with this as well.     ETyson Dense2/05/2023, 9:38  AM

## 2022-07-28 LAB — CBC
HCT: 30.1 % — ABNORMAL LOW (ref 36.0–46.0)
Hemoglobin: 10.4 g/dL — ABNORMAL LOW (ref 12.0–15.0)
MCH: 31 pg (ref 26.0–34.0)
MCHC: 34.6 g/dL (ref 30.0–36.0)
MCV: 89.6 fL (ref 80.0–100.0)
Platelets: 181 10*3/uL (ref 150–400)
RBC: 3.36 MIL/uL — ABNORMAL LOW (ref 3.87–5.11)
RDW: 13.8 % (ref 11.5–15.5)
WBC: 7.7 10*3/uL (ref 4.0–10.5)
nRBC: 0 % (ref 0.0–0.2)

## 2022-07-28 LAB — COMPREHENSIVE METABOLIC PANEL
ALT: 24 U/L (ref 0–44)
AST: 22 U/L (ref 15–41)
Albumin: 2.2 g/dL — ABNORMAL LOW (ref 3.5–5.0)
Alkaline Phosphatase: 103 U/L (ref 38–126)
Anion gap: 9 (ref 5–15)
BUN: 6 mg/dL (ref 6–20)
CO2: 22 mmol/L (ref 22–32)
Calcium: 8.9 mg/dL (ref 8.9–10.3)
Chloride: 103 mmol/L (ref 98–111)
Creatinine, Ser: 0.75 mg/dL (ref 0.44–1.00)
GFR, Estimated: >60 mL/min (ref >60)
Glucose, Bld: 93 mg/dL (ref 70–99)
Potassium: 3.7 mmol/L (ref 3.5–5.1)
Sodium: 134 mmol/L — ABNORMAL LOW (ref 135–145)
Total Bilirubin: 0.5 mg/dL (ref 0.3–1.2)
Total Protein: 5.4 g/dL — ABNORMAL LOW (ref 6.5–8.1)

## 2022-07-28 LAB — GLUCOSE, CAPILLARY
Glucose-Capillary: 73 mg/dL (ref 70–99)
Glucose-Capillary: 86 mg/dL (ref 70–99)
Glucose-Capillary: 92 mg/dL (ref 70–99)

## 2022-07-28 MED ORDER — HYDROXYZINE HCL 25 MG PO TABS
25.0000 mg | ORAL_TABLET | ORAL | Status: DC | PRN
  Administered 2022-07-28 – 2022-07-30 (×2): 25 mg via ORAL
  Filled 2022-07-28 (×3): qty 1

## 2022-07-28 NOTE — Progress Notes (Signed)
Patient ID: Ashley Hester, female   DOB: August 30, 1990, 32 y.o.   MRN: [id number]   Antepartum Progress Note     Ashley Hester is a 32 y.o. female [phone] that is admitted to [facility] for poorly controlled T1DM, today she is [redacted]w[redacted]d  Contractions completely resolved outside of typical Bhs. She has good FM and denies LOF and VB.  Now on dexcom and compared with fingerstick and they are consistent. Highest value in the last few days has been 160. Some mild lows early on in admission - but none severely symptomatic. Last 24 hours lowest in 80s. Highest 155. Denies HA, f/c, etc.  Having some palm itching today but no body itching.   Glc levels are great GFM FHR reactive History Dilation: 1 Effacement (%): Thick Exam by:: GMardelle Matte MD Blood pressure 118/65, pulse 81, temperature 98 F (36.7 C), temperature source Oral, resp. rate 17, height 5' 4"$  (1.626 m), weight 88.4 kg, last menstrual period 11/18/2021, SpO2 98 %, unknown if currently breastfeeding. Exam   Physical Exam: Gen: Alert, well appearing, no distress Chest: nonlabored breathing CV: no peripheral edema Abdomen: gravid, soft and nontender Ext: no evidence of DVT   FHT: reactive and reassuring, toco with irritability and contractions not felt by patient   [facility]: [facility] Insulin pump with revised settings.  Appreciate DM coordinator involvement BG is w/greatly improved control. Low values improving  History of atypical preeclampsia.  HELLP labs wnl, BP reassuring, P/C ratio 0.19. No current s/s PIH.  Will recheck CBC and CMP today has procardia ordered prn for ctxs but none today.  EFM and toco TID NST has been reassuring. Will change to daily NST or as indicated.  Diet: DM pregnancy diet DVT Ppx: SCDs Dispo: will  continue conservative management with inpatient monitoring of BG given her significant history of DKA with last pregnancy.  Patient feels more  comfortable with this as well.

## 2022-07-28 NOTE — Progress Notes (Signed)
Inpatient Diabetes Program Recommendations  ADA Standards of Care 2023 Diabetes in Pregnancy Target Glucose Ranges:  Fasting: 70 - 95 mg/dL 1 hr postprandial:  110 - 129m/dL (from first bite of meal) 2 hr postprandial:  100 - 120 mg/dL (from first bit of meal)    Lab Results  Component Value Date   GLUCAP 92 07/28/2022    Review of Glycemic Control  Latest Reference Range & Units 07/28/22 00:01 07/28/22 08:08 07/28/22 10:59  Glucose-Capillary 70 - 99 mg/dL 73 86 92   Diabetes history: DM 1  Current orders for Inpatient glycemic control:  T-Slim insulin pump  Inpatient Diabetes Program Recommendations:    Blood sugars improved today. No lows noted.   Patient states she is a little nauseas today.  Reported to RN.  Will follow.   Thanks,  JAdah Perl RN, BC-ADM Inpatient Diabetes Coordinator Pager [phone] (8a-5p)

## 2022-07-29 LAB — GLUCOSE, CAPILLARY
Glucose-Capillary: 105 mg/dL — ABNORMAL HIGH (ref 70–99)
Glucose-Capillary: 89 mg/dL (ref 70–99)
Glucose-Capillary: 69 mg/dL — ABNORMAL LOW (ref 70–99)
Glucose-Capillary: 144 mg/dL — ABNORMAL HIGH (ref 70–99)

## 2022-07-29 MED ORDER — SODIUM CHLORIDE 0.9% FLUSH
3.0000 mL | Freq: Two times a day (BID) | INTRAVENOUS | Status: DC
  Administered 2022-07-29 – 2022-08-03 (×9): 3 mL via INTRAVENOUS

## 2022-07-29 NOTE — Progress Notes (Addendum)
Patient ID: Ashley Hester, female   DOB: 29-Feb-1991, 32 y.o.   MRN: [id number]   Antepartum Progress Note     Ashley Hester is a 32 y.o. female [phone] that is admitted to [facility] for poorly controlled T1DM, today she is [redacted]w[redacted]d  Doing well, feeling some braxton hicks, no consistent contractions. Excellent FM. Using dexcom, doing well - blood sugars are well controlled. Highest value in the last few days has been 160. Some mild lows early on in admission - but none severely symptomatic. Last 24 hours lowest in 70s-80s. Highest 110s. No HA, RUQ pain, vision changes.     07/29/2022    8:14 AM 07/29/2022   12:03 AM 07/28/2022    7:29 PM  Vitals with BMI  Systolic 89 87 [id number] Diastolic 50 41 58  Pulse 89 86 89   Physical Exam: Gen: Alert, well appearing, no distress Chest: nonlabored breathing CV: no peripheral edema Abdomen: gravid, soft and nontender Ext: no evidence of DVT  NST reactive last night Cervical Exam on admission 1/th/hi   [facility]: [facility] History of DKA requiring transfer and intubation prior to last delivery Insulin pump with revised settings.  Appreciate DM coordinator involvement BG is w/greatly improved control. Lows are improved as well.  History of atypical preeclampsia.  HELLP labs wnl yesterday, BP reassuring, P/C ratio 0.19. No current s/s PIH. Continue weekly labs. Has procardia ordered prn for ctxs but nothing consistent today.  Daily NSTs - fetal monitoring has been reassuring  Diet: DM pregnancy diet DVT Ppx: SCDs Dispo: will  continue conservative management with inpatient monitoring of BG given her significant history of DKA with last pregnancy and difficulty with outpatient BG control (persistently elevated BG prior to admission with some lows).  Patient feels more comfortable with this plan given traumatic last delivery.   JHurshel Party MD

## 2022-07-29 NOTE — Plan of Care (Signed)
  Problem: Education: Goal: Knowledge of General Education information will improve Description: Including pain rating scale, medication(s)/side effects and non-pharmacologic comfort measures Outcome: Completed/Met   Problem: Activity: Goal: Risk for activity intolerance will decrease Outcome: Completed/Met   Problem: Nutrition: Goal: Adequate nutrition will be maintained Outcome: Completed/Met   Problem: Coping: Goal: Level of anxiety will decrease Outcome: Completed/Met   Problem: Elimination: Goal: Will not experience complications related to urinary retention Outcome: Completed/Met   Problem: Education: Goal: Knowledge of disease or condition will improve Outcome: Completed/Met Goal: Knowledge of the prescribed therapeutic regimen will improve Outcome: Completed/Met   Problem: Coping: Goal: Ability to adjust to condition or change in health will improve Outcome: Completed/Met   Problem: Nutritional: Goal: Maintenance of adequate nutrition will improve Outcome: Completed/Met

## 2022-07-30 LAB — GLUCOSE, CAPILLARY
Glucose-Capillary: 87 mg/dL (ref 70–99)
Glucose-Capillary: 120 mg/dL — ABNORMAL HIGH (ref 70–99)
Glucose-Capillary: 96 mg/dL (ref 70–99)

## 2022-07-30 MED ORDER — ORAL CARE MOUTH RINSE: 15.0000 mL | OROMUCOSAL | Status: DC | PRN

## 2022-07-30 NOTE — Progress Notes (Addendum)
No c/o.  Active FM.  Mild CTX off and on; no change from baseline.       07/30/2022   12:20 PM 07/30/2022    8:45 AM 07/30/2022    3:56 AM  Vitals with BMI  Systolic [id number]  Diastolic 73 67 66  Pulse 95 93 80      Latest Ref Rng & Units 07/28/2022   11:11 AM 07/24/2022    5:59 PM 07/19/2022   11:40 PM  CBC  WBC 4.0 - 10.5 K/uL 7.7  7.5  9.5   Hemoglobin 12.0 - 15.0 g/dL 10.4  10.7  10.3   Hematocrit 36.0 - 46.0 % 30.1  30.4  30.7   Platelets 150 - 400 K/uL 181  216  246       Latest Ref Rng & Units 07/28/2022   11:11 AM 07/26/2022    3:58 AM 07/24/2022    5:58 PM  CMP  Glucose 70 - 99 mg/dL 93  97  95   BUN 6 - 20 mg/dL 6  5  7   $ Creatinine 0.44 - 1.00 mg/dL 0.75  0.86  0.84   Sodium 135 - 145 mmol/L 134  138  135   Potassium 3.5 - 5.1 mmol/L 3.7  3.5  3.3   Chloride 98 - 111 mmol/L 103  106  105   CO2 22 - 32 mmol/L 22  22  21   $ Calcium 8.9 - 10.3 mg/dL 8.9  8.8  9.2   Total Protein 6.5 - 8.1 g/dL 5.4  5.3  6.0   Total Bilirubin 0.3 - 1.2 mg/dL 0.5  0.3  <0.1   Alkaline Phos 38 - 126 U/L 103  91  96   AST 15 - 41 U/L 22  21  24   $ ALT 0 - 44 U/L 24  24  29    $ CBG (last 3)  Recent Labs    07/29/22 1600 07/29/22 2025 07/30/22 0358  GLUCAP 69* 144* 87   Gen: A&O x 3 Abd: soft, NT Ext: no c/c/e  NST reactive  Assessment/Plan: 31yo [id number] [facility] 27[facility] History of DKA requiring transfer and intubation prior to last delivery Insulin pump with revised settings.  Appreciate DM coordinator involvement BG is w/greatly improved control. Lows are improved as well. Repeat C/S scheduled 2/20   History of atypical preeclampsia. BP reassuring, P/C ratio 0.19. No current s/s PIH. Continue weekly labs. Has procardia ordered prn for ctxs  Daily NSTs - fetal monitoring has been reassuring  Diet: DM pregnancy diet Anemia: plan IV Fe.  Patient counseled and accepts DVT Ppx: SCDs Dispo: will  continue conservative management  with inpatient monitoring of BG given her significant history of DKA with last pregnancy and difficulty with outpatient BG control (persistently elevated BG prior to admission with some lows).  Patient feels more comfortable with this plan given traumatic last delivery.  MLinda Hedges DO

## 2022-07-31 LAB — GLUCOSE, CAPILLARY
Glucose-Capillary: 114 mg/dL — ABNORMAL HIGH (ref 70–99)
Glucose-Capillary: 203 mg/dL — ABNORMAL HIGH (ref 70–99)
Glucose-Capillary: 99 mg/dL (ref 70–99)

## 2022-07-31 MED ORDER — ALBUTEROL SULFATE (2.5 MG/3ML) 0.083% IN NEBU: 2.5000 mg | INHALATION_SOLUTION | Freq: Once | RESPIRATORY_TRACT | Status: DC | PRN

## 2022-07-31 MED ORDER — SODIUM CHLORIDE 0.9 % IV SOLN
25.0000 mg | Freq: Once | INTRAVENOUS | Status: AC
  Administered 2022-07-31: 25 mg via INTRAVENOUS
  Filled 2022-07-31: qty 0.5

## 2022-07-31 MED ORDER — SODIUM CHLORIDE 0.9 % IV BOLUS: 500.0000 mL | Freq: Once | INTRAVENOUS | Status: DC | PRN

## 2022-07-31 MED ORDER — METHYLPREDNISOLONE SODIUM SUCC 125 MG IJ SOLR: 125.0000 mg | Freq: Once | INTRAMUSCULAR | Status: DC | PRN

## 2022-07-31 MED ORDER — DIPHENHYDRAMINE HCL 50 MG/ML IJ SOLN: 25.0000 mg | Freq: Once | INTRAMUSCULAR | Status: DC | PRN

## 2022-07-31 MED ORDER — SODIUM CHLORIDE 0.9 % IV SOLN: INTRAVENOUS | Status: DC | PRN

## 2022-07-31 MED ORDER — SODIUM CHLORIDE 0.9 % IV SOLN
975.0000 mg | Freq: Once | INTRAVENOUS | Status: AC
  Administered 2022-07-31: 975 mg via INTRAVENOUS
  Filled 2022-07-31: qty 19.5

## 2022-07-31 MED ORDER — EPINEPHRINE PF 1 MG/ML IJ SOLN: 0.3000 mg | Freq: Once | INTRAMUSCULAR | Status: DC | PRN

## 2022-07-31 NOTE — Inpatient Diabetes Management (Signed)
Inpatient Diabetes Program Recommendations  AACE/ADA: New Consensus Statement on Inpatient Glycemic Control (2015)  Target Ranges:  Prepandial:   less than 140 mg/dL      Peak postprandial:   less than 180 mg/dL (1-2 hours)      Critically ill patients:  140 - 180 mg/dL   Lab Results  Component Value Date   GLUCAP 99 07/31/2022   Diabetes history: DM 1   Current orders for Inpatient glycemic control:  T-Slim insulin pump   Inpatient Diabetes Program Recommendations:    Spoke with patient briefly regarding post partum settings in the event of delivery. Patient has temp settings in Tslim pump from endocrinology (basal 1.0 units/hr, CR 1:15, SF 1:50) and discussed plan for application and resuming temp settings in PACU. Patient comfortable with plan and has no further questions.   Thanks, Bronson Curb, MSN, RNC-OB Diabetes Coordinator [phone] (8a-5p)

## 2022-07-31 NOTE — Progress Notes (Signed)
No C/O Some mild UCs, no ROM  Today's Vitals   07/31/22 0050 07/31/22 0500 07/31/22 0700 07/31/22 0730  BP:      Pulse:      Resp:      Temp:      TempSrc:      SpO2:      Weight:      Height:      PainSc: Asleep Asleep Asleep 0-No pain   Body mass index is 33.44 kg/m.   NST reactive  Gen: A&O x 3 Abd: soft, NT Ext: no c/c/e  IV iron infusing  Assessment/Plan: 32yo [id number] at 67w2[facility] History of DKA requiring transfer and intubation prior to last delivery Insulin pump with revised settings.  Appreciate DM coordinator involvement BG is w/greatly improved control. Lows are improved as well. Repeat C/S scheduled 2/20   History of atypical preeclampsia. BP reassuring, P/C ratio 0.19. No current s/s PIH. Continue weekly labs. Has procardia ordered prn for ctxs  Daily NSTs - fetal monitoring has been reassuring  Diet: DM pregnancy diet Anemia: IV iron infusing DVT Ppx: SCDs Dispo: will  continue conservative management with inpatient monitoring of BG given her significant history of DKA with last pregnancy and difficulty with outpatient BG control (persistently elevated BG prior to admission with some lows).  Patient feels more comfortable with this plan given traumatic last delivery.

## 2022-08-01 LAB — TYPE AND SCREEN
ABO/RH(D): B POS
Antibody Screen: NEGATIVE

## 2022-08-01 LAB — CBC
HCT: 30.4 % — ABNORMAL LOW (ref 36.0–46.0)
Hemoglobin: 10.5 g/dL — ABNORMAL LOW (ref 12.0–15.0)
MCH: 31 pg (ref 26.0–34.0)
MCHC: 34.5 g/dL (ref 30.0–36.0)
MCV: 89.7 fL (ref 80.0–100.0)
Platelets: 204 10*3/uL (ref 150–400)
RBC: 3.39 MIL/uL — ABNORMAL LOW (ref 3.87–5.11)
RDW: 14 % (ref 11.5–15.5)
WBC: 9.4 10*3/uL (ref 4.0–10.5)
nRBC: 0 % (ref 0.0–0.2)

## 2022-08-01 LAB — COMPREHENSIVE METABOLIC PANEL
ALT: 20 U/L (ref 0–44)
AST: 21 U/L (ref 15–41)
Albumin: 2.2 g/dL — ABNORMAL LOW (ref 3.5–5.0)
Alkaline Phosphatase: 102 U/L (ref 38–126)
Anion gap: 9 (ref 5–15)
BUN: <5 mg/dL — ABNORMAL LOW (ref 6–20)
CO2: 21 mmol/L — ABNORMAL LOW (ref 22–32)
Calcium: 8.9 mg/dL (ref 8.9–10.3)
Chloride: 105 mmol/L (ref 98–111)
Creatinine, Ser: 0.71 mg/dL (ref 0.44–1.00)
GFR, Estimated: >60 mL/min (ref >60)
Glucose, Bld: 105 mg/dL — ABNORMAL HIGH (ref 70–99)
Potassium: 3.4 mmol/L — ABNORMAL LOW (ref 3.5–5.1)
Sodium: 135 mmol/L (ref 135–145)
Total Bilirubin: 0.4 mg/dL (ref 0.3–1.2)
Total Protein: 5.5 g/dL — ABNORMAL LOW (ref 6.5–8.1)

## 2022-08-01 LAB — GLUCOSE, CAPILLARY
Glucose-Capillary: 105 mg/dL — ABNORMAL HIGH (ref 70–99)
Glucose-Capillary: 168 mg/dL — ABNORMAL HIGH (ref 70–99)

## 2022-08-01 MED ORDER — OXYCODONE HCL 5 MG PO TABS: 5.0000 mg | ORAL_TABLET | Freq: Once | ORAL | Status: DC

## 2022-08-01 MED ORDER — NIFEDIPINE 10 MG PO CAPS
10.0000 mg | ORAL_CAPSULE | Freq: Once | ORAL | Status: AC
  Administered 2022-08-01: 10 mg via ORAL
  Filled 2022-08-01: qty 1

## 2022-08-01 MED ORDER — OXYCODONE HCL 5 MG PO TABS
5.0000 mg | ORAL_TABLET | Freq: Once | ORAL | Status: AC
  Administered 2022-08-01: 5 mg via ORAL
  Filled 2022-08-01: qty 1

## 2022-08-01 MED ORDER — LACTATED RINGERS IV BOLUS
250.0000 mL | Freq: Once | INTRAVENOUS | Status: AC
  Administered 2022-08-01: 250 mL via INTRAVENOUS

## 2022-08-01 NOTE — Progress Notes (Signed)
No C/O Good FM Had glucose high yesterday of 200s and some lows, 105 this am  Today's Vitals   08/01/22 0351 08/01/22 0600 08/01/22 0817 08/01/22 0820  BP: 93/75  (!) 104/51   Pulse: 100  (!) 101   Resp: 18  18   Temp: 98 F (36.7 C)  97.9 F (36.6 C)   TempSrc: Oral  Oral   SpO2: 100%  98%   Weight:      Height:      PainSc:  Asleep  0-No pain   Body mass index is 33.44 kg/m.   NST reactive  Gen: A&O x 3 Abd: soft, NT Ext: no c/c/e  Assessment/Plan: [id number] [facility] 32[facility] History of DKA requiring transfer and intubation prior to last delivery Insulin pump with revised settings.  Appreciate DM coordinator involvement BG is w/greatly improved control. Lows are improved as well. Repeat C/S scheduled 2/20   History of atypical preeclampsia. BP reassuring, P/C ratio 0.19. No current s/s PIH. Continue weekly labs. Has procardia ordered prn for ctxs  Daily NSTs - fetal monitoring has been reassuring  Diet: DM pregnancy diet Anemia: IV iron infusion 07/31/22 DVT Ppx: SCDs Dispo: will  continue conservative management with inpatient monitoring of BG given her significant history of DKA with last pregnancy and difficulty with outpatient BG control (persistently elevated BG prior to admission with some lows).  Patient feels more comfortable with this plan given traumatic last delivery.

## 2022-08-01 NOTE — Progress Notes (Signed)
HA continues  FHT cat one UC-irritability  A/P: oxycodone 83m po once         CBC, CMET now

## 2022-08-01 NOTE — Progress Notes (Signed)
Ashley Hester C/O pelvic pressure-no bleeding, no leaking, no abdominal pain.  Also HA behind her eyes. No blurry vision.  Today's Vitals   08/01/22 1619 08/01/22 1955 08/01/22 2000 08/01/22 2140  BP: 121/71 (!) 107/53    Pulse: 80 91    Resp: 18 18    Temp: 98.1 F (36.7 C)     TempSrc: Oral     SpO2: 97% 97%    Weight:      Height:      PainSc:   0-No pain 6    Body mass index is 33.44 kg/m.  Repeat BP just now 100/54  Abdomen- uterus soft, NT Cx closed/thick/soft/-3/vtx/no blood  FHT cat one UCs mild, irregular UCs with  uterine irritability   IV fluid bolus 264m Procardia 158mpo done Tylenol about 15 minutes ago  A/P: FWB reassuring         No evidence of active labor         Continuous fetal monitoring overnight

## 2022-08-02 LAB — GLUCOSE, CAPILLARY
Glucose-Capillary: 177 mg/dL — ABNORMAL HIGH (ref 70–99)
Glucose-Capillary: 76 mg/dL (ref 70–99)
Glucose-Capillary: 81 mg/dL (ref 70–99)
Glucose-Capillary: 86 mg/dL (ref 70–99)

## 2022-08-02 NOTE — Plan of Care (Signed)
  Problem: Health Behavior/Discharge Planning: Goal: Ability to manage health-related needs will improve Outcome: Progressing   Problem: Clinical Measurements: Goal: Ability to maintain clinical measurements within normal limits will improve Outcome: Progressing Goal: Will remain free from infection Outcome: Progressing Goal: Diagnostic test results will improve Outcome: Progressing Goal: Respiratory complications will improve Outcome: Progressing Goal: Cardiovascular complication will be avoided Outcome: Progressing   Problem: Elimination: Goal: Will not experience complications related to bowel motility Outcome: Progressing   Problem: Pain Managment: Goal: General experience of comfort will improve Outcome: Progressing   Problem: Safety: Goal: Ability to remain free from injury will improve Outcome: Progressing   Problem: Skin Integrity: Goal: Risk for impaired skin integrity will decrease Outcome: Progressing   Problem: Education: Goal: Individualized Educational Video(s) Outcome: Progressing   Problem: Clinical Measurements: Goal: Complications related to the disease process, condition or treatment will be avoided or minimized Outcome: Progressing   Problem: Education: Goal: Ability to describe self-care measures that may prevent or decrease complications (Diabetes Survival Skills Education) will improve Outcome: Progressing Goal: Individualized Educational Video(s) Outcome: Progressing   Problem: Fluid Volume: Goal: Ability to maintain a balanced intake and output will improve Outcome: Progressing   Problem: Health Behavior/Discharge Planning: Goal: Ability to identify and utilize available resources and services will improve Outcome: Progressing Goal: Ability to manage health-related needs will improve Outcome: Progressing   Problem: Metabolic: Goal: Ability to maintain appropriate glucose levels will improve Outcome: Progressing   Problem:  Nutritional: Goal: Progress toward achieving an optimal weight will improve Outcome: Progressing   Problem: Skin Integrity: Goal: Risk for impaired skin integrity will decrease Outcome: Progressing   Problem: Tissue Perfusion: Goal: Adequacy of tissue perfusion will improve Outcome: Progressing

## 2022-08-02 NOTE — Progress Notes (Signed)
HA mild, no vision change Vaginal pressure unchanged, no leaking, no bleeding  Today's Vitals   08/02/22 0000 08/02/22 0003 08/02/22 0525 08/02/22 0807  BP:  (!) 97/58 (!) 86/38 (!) 95/47  Pulse:  98 87 88  Resp:  17 16 16  $ Temp:  98.2 F (36.8 C) 98.2 F (36.8 C) 98 F (36.7 C)  TempSrc:  Oral Oral Oral  SpO2:  97% 98% 98%  Weight:      Height:      PainSc: 3   0-No pain    Body mass index is 33.44 kg/m.   Abdomen, uterus soft, NT  NST reactive UCs irritability  Results for orders placed or performed during the hospital encounter of 07/24/22 (from the past 24 hour(s))  Type and screen Pax     Status: None   Collection Time: 08/01/22  9:06 AM  Result Value Ref Range   ABO/RH(D) B POS    Antibody Screen NEG    Sample Expiration      08/04/2022,2359 Performed at [facility], [address], [place], [place] [address]   Glucose, capillary     Status: Abnormal   Collection Time: 08/01/22  4:20 PM  Result Value Ref Range   Glucose-Capillary 168 (H) 70 - 99 mg/dL  CBC     Status: Abnormal   Collection Time: 08/01/22 11:15 PM  Result Value Ref Range   WBC 9.4 4.0 - 10.5 K/uL   RBC 3.39 (L) 3.87 - 5.11 MIL/uL   Hemoglobin 10.5 (L) 12.0 - 15.0 g/dL   HCT 30.4 (L) 36.0 - 46.0 %   MCV 89.7 80.0 - 100.0 fL   MCH 31.0 26.0 - 34.0 pg   MCHC 34.5 30.0 - 36.0 g/dL   RDW 14.0 11.5 - 15.5 %   Platelets 204 150 - 400 K/uL   nRBC 0.0 0.0 - 0.2 %  Comprehensive metabolic panel     Status: Abnormal   Collection Time: 08/01/22 11:15 PM  Result Value Ref Range   Sodium 135 135 - 145 mmol/L   Potassium 3.4 (L) 3.5 - 5.1 mmol/L   Chloride 105 98 - 111 mmol/L   CO2 21 (L) 22 - 32 mmol/L   Glucose, Bld 105 (H) 70 - 99 mg/dL   BUN <5 (L) 6 - 20 mg/dL   Creatinine, Ser 0.71 0.44 - 1.00 mg/dL   Calcium 8.9 8.9 - 10.3 mg/dL   Total Protein 5.5 (L) 6.5 - 8.1 g/dL   Albumin 2.2 (L) 3.5 - 5.0 g/dL   AST 21 15 - 41 U/L   ALT 20 0 - 44 U/L   Alkaline  Phosphatase 102 38 - 126 U/L   Total Bilirubin 0.4 0.3 - 1.2 mg/dL   GFR, Estimated >60 >60 mL/min   Anion gap 9 5 - 15  Glucose, capillary     Status: None   Collection Time: 08/02/22 12:01 AM  Result Value Ref Range   Glucose-Capillary 81 70 - 99 mg/dL  Glucose, capillary     Status: None   Collection Time: 08/02/22  8:04 AM  Result Value Ref Range   Glucose-Capillary 86 70 - 99 mg/dL    Assessment/Plan: 31yo [id number] [facility] 54[facility] History of DKA requiring transfer and intubation prior to last delivery Insulin pump with revised settings.  Appreciate DM coordinator involvement BG is w/greatly improved control. Lows are improved as well. Repeat C/S scheduled 2/20  History of atypical preeclampsia. BP reassuring, P/C ratio 0.19. No current s/s PIH. Continue weekly labs. Has procardia ordered prn for ctxs  Daily NSTs - fetal monitoring has been reassuring  Diet: DM pregnancy diet Anemia: IV iron infusion 07/31/22 DVT Ppx: SCDs Dispo: will  continue conservative management with inpatient monitoring of BG given her significant history of DKA with last pregnancy and difficulty with outpatient BG control (persistently elevated BG prior to admission with some lows).  Patient feels more comfortable with this plan given traumatic last delivery.

## 2022-08-03 ENCOUNTER — Encounter (HOSPITAL_COMMUNITY)
Admission: RE | Admit: 2022-08-03 | Discharge: 2022-08-03 | Disposition: A | Discharge status: Home / Self Care | Payer: 59 | Source: Ambulatory Visit | Attending: Family Medicine | Admitting: Family Medicine

## 2022-08-03 DIAGNOSIS — Z98891 History of uterine scar from previous surgery: Secondary | ICD-10-CM

## 2022-08-03 LAB — CBC
HCT: 31.9 % — ABNORMAL LOW (ref 36.0–46.0)
Hemoglobin: 10.5 g/dL — ABNORMAL LOW (ref 12.0–15.0)
MCH: 30.2 pg (ref 26.0–34.0)
MCHC: 32.9 g/dL (ref 30.0–36.0)
MCV: 91.7 fL (ref 80.0–100.0)
Platelets: 219 10*3/uL (ref 150–400)
RBC: 3.48 MIL/uL — ABNORMAL LOW (ref 3.87–5.11)
RDW: 14.1 % (ref 11.5–15.5)
WBC: 9.5 10*3/uL (ref 4.0–10.5)
nRBC: 0 % (ref 0.0–0.2)

## 2022-08-03 LAB — COMPREHENSIVE METABOLIC PANEL
ALT: 21 U/L (ref 0–44)
AST: 26 U/L (ref 15–41)
Albumin: 2.4 g/dL — ABNORMAL LOW (ref 3.5–5.0)
Alkaline Phosphatase: 110 U/L (ref 38–126)
Anion gap: 9 (ref 5–15)
BUN: 6 mg/dL (ref 6–20)
CO2: 21 mmol/L — ABNORMAL LOW (ref 22–32)
Calcium: 9.4 mg/dL (ref 8.9–10.3)
Chloride: 104 mmol/L (ref 98–111)
Creatinine, Ser: 0.77 mg/dL (ref 0.44–1.00)
GFR, Estimated: >60 mL/min (ref >60)
Glucose, Bld: 142 mg/dL — ABNORMAL HIGH (ref 70–99)
Potassium: 3.7 mmol/L (ref 3.5–5.1)
Sodium: 134 mmol/L — ABNORMAL LOW (ref 135–145)
Total Bilirubin: 0.4 mg/dL (ref 0.3–1.2)
Total Protein: 5.9 g/dL — ABNORMAL LOW (ref 6.5–8.1)

## 2022-08-03 LAB — TYPE AND SCREEN
ABO/RH(D): B POS
Antibody Screen: NEGATIVE

## 2022-08-03 LAB — GLUCOSE, CAPILLARY
Glucose-Capillary: 71 mg/dL (ref 70–99)
Glucose-Capillary: 79 mg/dL (ref 70–99)
Glucose-Capillary: 80 mg/dL (ref 70–99)

## 2022-08-03 LAB — HIV ANTIBODY (ROUTINE TESTING W REFLEX): HIV Screen 4th Generation wRfx: NONREACTIVE

## 2022-08-03 MED ORDER — CEFAZOLIN SODIUM-DEXTROSE 2-4 GM/100ML-% IV SOLN
2.0000 g | INTRAVENOUS | Status: AC
  Administered 2022-08-04: 2 g via INTRAVENOUS
  Filled 2022-08-03: qty 100

## 2022-08-03 MED ORDER — LACTATED RINGERS IV SOLN: INTRAVENOUS | Status: DC

## 2022-08-03 MED ORDER — SOD CITRATE-CITRIC ACID 500-334 MG/5ML PO SOLN
30.0000 mL | Freq: Once | ORAL | Status: AC
  Administered 2022-08-04: 30 mL via ORAL
  Filled 2022-08-03: qty 30

## 2022-08-03 MED ORDER — LACTATED RINGERS IV SOLN
INTRAVENOUS | Status: DC
  Filled 2022-08-03: qty 1000

## 2022-08-03 MED ORDER — POVIDONE-IODINE 10 % EX SWAB: 2.0000 | Freq: Once | CUTANEOUS | Status: DC

## 2022-08-03 NOTE — Anesthesia Preprocedure Evaluation (Signed)
Anesthesia Evaluation  Patient identified by MRN, date of birth, ID band Patient awake    Reviewed: Allergy & Precautions, NPO status , Patient's Chart, lab work & pertinent test results  History of Anesthesia Complications (+) PONV and history of anesthetic complications  Airway Mallampati: I  TM Distance: >3 FB Neck ROM: Full    Dental  (+) Teeth Intact, Dental Advisory Given   Pulmonary    Pulmonary exam normal breath sounds clear to auscultation       Cardiovascular negative cardio ROS Normal cardiovascular exam Rhythm:Regular Rate:Normal     Neuro/Psych negative neurological ROS  negative psych ROS   GI/Hepatic negative GI ROS, Neg liver ROS,,,  Endo/Other  diabetes, Well Controlled, Type 2, Insulin Dependent  Previous pregnancy complicated by episode of DKA and then preE  Renal/GU negative Renal ROS  negative genitourinary   Musculoskeletal negative musculoskeletal ROS (+)    Abdominal   Peds  Hematology  (+) Blood dyscrasia, anemia Hb 10.5, plt 204   Anesthesia Other Findings   Reproductive/Obstetrics (+) Pregnancy Previous section under epidural 2018                             Anesthesia Physical Anesthesia Plan  ASA: 3  Anesthesia Plan: Spinal   Post-op Pain Management: Regional block, Toradol IV (intra-op)* and Ofirmev IV (intra-op)*   Induction:   PONV Risk Score and Plan: 3 and Ondansetron, Dexamethasone and Treatment may vary due to age or medical condition  Airway Management Planned: Natural Airway and Nasal Cannula  Additional Equipment: None  Intra-op Plan:   Post-operative Plan:   Informed Consent: I have reviewed the patients History and Physical, chart, labs and discussed the procedure including the risks, benefits and alternatives for the proposed anesthesia with the patient or authorized representative who has indicated his/her understanding and  acceptance.       Plan Discussed with: CRNA  Anesthesia Plan Comments:         Anesthesia Quick Evaluation

## 2022-08-03 NOTE — Progress Notes (Signed)
Antepartum Progress Note   Ashley Hester is a 32 y.o. female [phone] that is admitted to [facility] for glucoregulation in the setting of T1DM.  Today she is [redacted]w[redacted]d  Overnight, she reports no acute events.  Admits fetal movement, denies contractions, denies leakage of fluid, denies vaginal bleeding.  History Dilation: Fingertip Effacement (%): Thick Exam by:: Dr TGaetano NetBlood pressure (!) 106/53, pulse 89, temperature 98.1 F (36.7 C), temperature source Oral, resp. rate 17, height 5' 4"$  (1.626 m), weight 88.4 kg, last menstrual period 11/18/2021, SpO2 97 %, unknown if currently breastfeeding. Exam  Physical Exam: Gen: Alert, well appearing, no distress Chest: nonlabored breathing CV: no peripheral edema Abdomen: gravid, soft and nontender Ext: no evidence of DVT  FHT: reactive and reassuring  [facility]: [facility] History of DKA requiring transfer and intubation prior to last delivery Insulin pump with revised settings.  Appreciate DM coordinator involvement Repeat CS scheduled tomorrow 2/20, will plan for NPO at midnight and discuss transition to postpartum pump settings vs IV insulin perioperatively EFM and toco - daily NST History of atypical preeclampsia. BP has been reassuring, headache over the weekend improved, CMP wnl Diet: DM pregnancy diet Anemia - s/p iron infusion 07/31/22 DVT Ppx: SCDs Dispo: Continued conservative management given significant history of DKA requiring intubation last pregnancy. RCS scheduled tomorrow.   TCarlyon Shadow2/19/2024, 7:47 AM

## 2022-08-04 ENCOUNTER — Encounter (HOSPITAL_COMMUNITY): Payer: Self-pay | Admitting: Obstetrics & Gynecology

## 2022-08-04 ENCOUNTER — Inpatient Hospital Stay (HOSPITAL_COMMUNITY): Admission: RE | Admit: 2022-08-04 | Payer: 59 | Source: Home / Self Care | Admitting: Obstetrics & Gynecology

## 2022-08-04 ENCOUNTER — Encounter (HOSPITAL_COMMUNITY)
Admission: AD | Disposition: A | Discharge status: Home / Self Care | Payer: Self-pay | Source: Home / Self Care | Attending: Obstetrics and Gynecology

## 2022-08-04 ENCOUNTER — Inpatient Hospital Stay (HOSPITAL_COMMUNITY): Payer: 59 | Admitting: Anesthesiology

## 2022-08-04 DIAGNOSIS — Z3A37 37 weeks gestation of pregnancy: Secondary | ICD-10-CM

## 2022-08-04 DIAGNOSIS — O34211 Maternal care for low transverse scar from previous cesarean delivery: Secondary | ICD-10-CM

## 2022-08-04 DIAGNOSIS — Z98891 History of uterine scar from previous surgery: Secondary | ICD-10-CM

## 2022-08-04 DIAGNOSIS — O2402 Pre-existing diabetes mellitus, type 1, in childbirth: Secondary | ICD-10-CM

## 2022-08-04 DIAGNOSIS — O34219 Maternal care for unspecified type scar from previous cesarean delivery: Secondary | ICD-10-CM

## 2022-08-04 LAB — GLUCOSE, CAPILLARY
Glucose-Capillary: 72 mg/dL (ref 70–99)
Glucose-Capillary: 66 mg/dL — ABNORMAL LOW (ref 70–99)
Glucose-Capillary: 112 mg/dL — ABNORMAL HIGH (ref 70–99)
Glucose-Capillary: 122 mg/dL — ABNORMAL HIGH (ref 70–99)
Glucose-Capillary: 94 mg/dL (ref 70–99)
Glucose-Capillary: 87 mg/dL (ref 70–99)
Glucose-Capillary: 107 mg/dL — ABNORMAL HIGH (ref 70–99)
Glucose-Capillary: 137 mg/dL — ABNORMAL HIGH (ref 70–99)
Glucose-Capillary: 165 mg/dL — ABNORMAL HIGH (ref 70–99)

## 2022-08-04 LAB — RPR: RPR Ser Ql: NONREACTIVE

## 2022-08-04 SURGERY — Surgical Case
Anesthesia: Spinal | Site: Abdomen

## 2022-08-04 MED ORDER — MENTHOL 3 MG MT LOZG: 1.0000 | LOZENGE | OROMUCOSAL | Status: DC | PRN

## 2022-08-04 MED ORDER — HYDROMORPHONE HCL 1 MG/ML IJ SOLN: 0.2500 mg | INTRAMUSCULAR | Status: DC | PRN

## 2022-08-04 MED ORDER — OXYCODONE HCL 5 MG PO TABS: 5.0000 mg | ORAL_TABLET | Freq: Once | ORAL | Status: DC | PRN

## 2022-08-04 MED ORDER — WITCH HAZEL-GLYCERIN EX PADS: 1.0000 | MEDICATED_PAD | CUTANEOUS | Status: DC | PRN

## 2022-08-04 MED ORDER — PRENATAL MULTIVITAMIN CH: 1.0000 | ORAL_TABLET | Freq: Every day | ORAL | Status: DC

## 2022-08-04 MED ORDER — STERILE WATER FOR IRRIGATION IR SOLN
Status: DC | PRN
  Administered 2022-08-04: 1

## 2022-08-04 MED ORDER — SODIUM CHLORIDE 0.9 % IR SOLN
Status: DC | PRN
  Administered 2022-08-04: 1

## 2022-08-04 MED ORDER — DIBUCAINE (PERIANAL) 1 % EX OINT: 1.0000 | TOPICAL_OINTMENT | CUTANEOUS | Status: DC | PRN

## 2022-08-04 MED ORDER — OXYTOCIN-SODIUM CHLORIDE 30-0.9 UT/500ML-% IV SOLN
INTRAVENOUS | Status: AC
  Filled 2022-08-04: qty 500

## 2022-08-04 MED ORDER — SENNOSIDES-DOCUSATE SODIUM 8.6-50 MG PO TABS
2.0000 | ORAL_TABLET | Freq: Every day | ORAL | Status: DC
  Administered 2022-08-05 – 2022-08-07 (×3): 2 via ORAL
  Filled 2022-08-04 (×4): qty 2

## 2022-08-04 MED ORDER — SIMETHICONE 80 MG PO CHEW
80.0000 mg | CHEWABLE_TABLET | Freq: Three times a day (TID) | ORAL | Status: DC
  Administered 2022-08-04 – 2022-08-07 (×8): 80 mg via ORAL
  Filled 2022-08-04 (×9): qty 1

## 2022-08-04 MED ORDER — ONDANSETRON HCL 4 MG/2ML IJ SOLN
INTRAMUSCULAR | Status: AC
  Filled 2022-08-04: qty 2

## 2022-08-04 MED ORDER — LACTATED RINGERS IV SOLN: INTRAVENOUS | Status: DC

## 2022-08-04 MED ORDER — ONDANSETRON HCL 4 MG/2ML IJ SOLN
INTRAMUSCULAR | Status: AC
  Administered 2022-08-04: 4 mg
  Filled 2022-08-04: qty 2

## 2022-08-04 MED ORDER — MEPERIDINE HCL 25 MG/ML IJ SOLN: 6.2500 mg | INTRAMUSCULAR | Status: DC | PRN

## 2022-08-04 MED ORDER — OXYTOCIN-SODIUM CHLORIDE 30-0.9 UT/500ML-% IV SOLN
INTRAVENOUS | Status: DC | PRN
  Administered 2022-08-04: 300 mL via INTRAVENOUS

## 2022-08-04 MED ORDER — MORPHINE SULFATE (PF) 0.5 MG/ML IJ SOLN
INTRAMUSCULAR | Status: DC | PRN
  Administered 2022-08-04: 150 ug via INTRATHECAL

## 2022-08-04 MED ORDER — ONDANSETRON HCL 4 MG/2ML IJ SOLN
INTRAMUSCULAR | Status: DC | PRN
  Administered 2022-08-04: 4 mg via INTRAVENOUS

## 2022-08-04 MED ORDER — PHENYLEPHRINE 80 MCG/ML (10ML) SYRINGE FOR IV PUSH (FOR BLOOD PRESSURE SUPPORT)
PREFILLED_SYRINGE | INTRAVENOUS | Status: AC
  Filled 2022-08-04: qty 10

## 2022-08-04 MED ORDER — ALBUMIN HUMAN 5 % IV SOLN
INTRAVENOUS | Status: AC
  Filled 2022-08-04: qty 250

## 2022-08-04 MED ORDER — FENTANYL CITRATE (PF) 100 MCG/2ML IJ SOLN
INTRAMUSCULAR | Status: AC
  Filled 2022-08-04: qty 2

## 2022-08-04 MED ORDER — ONDANSETRON HCL 4 MG/2ML IJ SOLN: 4.0000 mg | Freq: Once | INTRAMUSCULAR | Status: DC | PRN

## 2022-08-04 MED ORDER — KETOROLAC TROMETHAMINE 30 MG/ML IJ SOLN
30.0000 mg | Freq: Once | INTRAMUSCULAR | Status: AC | PRN
  Administered 2022-08-04: 30 mg via INTRAVENOUS

## 2022-08-04 MED ORDER — FENTANYL CITRATE (PF) 100 MCG/2ML IJ SOLN
INTRAMUSCULAR | Status: DC | PRN
  Administered 2022-08-04: 15 ug via INTRATHECAL

## 2022-08-04 MED ORDER — IBUPROFEN 600 MG PO TABS
600.0000 mg | ORAL_TABLET | Freq: Four times a day (QID) | ORAL | Status: DC
  Administered 2022-08-04 – 2022-08-07 (×12): 600 mg via ORAL
  Filled 2022-08-04 (×12): qty 1

## 2022-08-04 MED ORDER — ALBUMIN HUMAN 5 % IV SOLN: INTRAVENOUS | Status: DC | PRN

## 2022-08-04 MED ORDER — COCONUT OIL OIL
1.0000 | TOPICAL_OIL | Status: DC | PRN
  Administered 2022-08-05: 1 via TOPICAL

## 2022-08-04 MED ORDER — ONDANSETRON HCL 4 MG/5ML PO SOLN
4.0000 mg | Freq: Four times a day (QID) | ORAL | Status: DC | PRN
  Administered 2022-08-04: 4 mg via ORAL
  Filled 2022-08-04 (×2): qty 5

## 2022-08-04 MED ORDER — DIPHENHYDRAMINE HCL 25 MG PO CAPS: 25.0000 mg | ORAL_CAPSULE | Freq: Four times a day (QID) | ORAL | Status: DC | PRN

## 2022-08-04 MED ORDER — BUPIVACAINE IN DEXTROSE 0.75-8.25 % IT SOLN
INTRATHECAL | Status: DC | PRN
  Administered 2022-08-04: 1.4 mL via INTRATHECAL

## 2022-08-04 MED ORDER — LACTATED RINGERS IV SOLN: INTRAVENOUS | Status: DC | PRN

## 2022-08-04 MED ORDER — ACETAMINOPHEN 500 MG PO TABS
1000.0000 mg | ORAL_TABLET | Freq: Four times a day (QID) | ORAL | Status: DC
  Administered 2022-08-04 – 2022-08-07 (×13): 1000 mg via ORAL
  Filled 2022-08-04 (×13): qty 2

## 2022-08-04 MED ORDER — SIMETHICONE 80 MG PO CHEW
80.0000 mg | CHEWABLE_TABLET | ORAL | Status: DC | PRN
  Administered 2022-08-05 (×2): 80 mg via ORAL
  Filled 2022-08-04 (×2): qty 1

## 2022-08-04 MED ORDER — PHENYLEPHRINE HCL-NACL 20-0.9 MG/250ML-% IV SOLN
INTRAVENOUS | Status: DC | PRN
  Administered 2022-08-04: 60 ug/min via INTRAVENOUS

## 2022-08-04 MED ORDER — PHENYLEPHRINE 80 MCG/ML (10ML) SYRINGE FOR IV PUSH (FOR BLOOD PRESSURE SUPPORT)
PREFILLED_SYRINGE | INTRAVENOUS | Status: DC | PRN
  Administered 2022-08-04 (×5): 80 ug via INTRAVENOUS

## 2022-08-04 MED ORDER — KETOROLAC TROMETHAMINE 30 MG/ML IJ SOLN
INTRAMUSCULAR | Status: AC
  Filled 2022-08-04: qty 1

## 2022-08-04 MED ORDER — OXYTOCIN-SODIUM CHLORIDE 30-0.9 UT/500ML-% IV SOLN
2.5000 [IU]/h | INTRAVENOUS | Status: AC
  Administered 2022-08-04: 2.5 [IU]/h via INTRAVENOUS

## 2022-08-04 MED ORDER — FERROUS SULFATE 325 (65 FE) MG PO TABS
325.0000 mg | ORAL_TABLET | Freq: Every day | ORAL | Status: DC
  Administered 2022-08-05: 325 mg via ORAL
  Filled 2022-08-04 (×3): qty 1

## 2022-08-04 MED ORDER — MORPHINE SULFATE (PF) 0.5 MG/ML IJ SOLN
INTRAMUSCULAR | Status: AC
  Filled 2022-08-04: qty 10

## 2022-08-04 MED ORDER — OXYCODONE HCL 5 MG PO TABS
5.0000 mg | ORAL_TABLET | ORAL | Status: DC | PRN
  Administered 2022-08-04 – 2022-08-07 (×7): 5 mg via ORAL
  Filled 2022-08-04 (×7): qty 1

## 2022-08-04 MED ORDER — OXYCODONE HCL 5 MG/5ML PO SOLN: 5.0000 mg | Freq: Once | ORAL | Status: DC | PRN

## 2022-08-04 MED ORDER — PHENYLEPHRINE HCL-NACL 20-0.9 MG/250ML-% IV SOLN
INTRAVENOUS | Status: AC
  Filled 2022-08-04: qty 250

## 2022-08-04 MED ORDER — ZOLPIDEM TARTRATE 5 MG PO TABS: 5.0000 mg | ORAL_TABLET | Freq: Every evening | ORAL | Status: DC | PRN

## 2022-08-04 MED ORDER — AMISULPRIDE (ANTIEMETIC) 5 MG/2ML IV SOLN: 10.0000 mg | Freq: Once | INTRAVENOUS | Status: DC | PRN

## 2022-08-04 SURGICAL SUPPLY — 34 items
ADH SKN CLS APL DERMABOND .7 (GAUZE/BANDAGES/DRESSINGS)
APL PRP STRL LF DISP 70% ISPRP (MISCELLANEOUS) ×2
APL SKNCLS STERI-STRIP NONHPOA (GAUZE/BANDAGES/DRESSINGS) ×1
BENZOIN TINCTURE PRP APPL 2/3 (GAUZE/BANDAGES/DRESSINGS) ×1 IMPLANT
CHLORAPREP W/TINT 26 (MISCELLANEOUS) ×2 IMPLANT
CLAMP UMBILICAL CORD (MISCELLANEOUS) ×1 IMPLANT
CLOTH BEACON ORANGE TIMEOUT ST (SAFETY) ×1 IMPLANT
DERMABOND ADVANCED .7 DNX12 (GAUZE/BANDAGES/DRESSINGS) IMPLANT
DRSG OPSITE POSTOP 4X10 (GAUZE/BANDAGES/DRESSINGS) ×1 IMPLANT
ELECT REM PT RETURN 9FT ADLT (ELECTROSURGICAL) ×1
ELECTRODE REM PT RTRN 9FT ADLT (ELECTROSURGICAL) ×1 IMPLANT
EXTRACTOR VACUUM KIWI (MISCELLANEOUS) IMPLANT
GAUZE SPONGE 4X4 12PLY STRL LF (GAUZE/BANDAGES/DRESSINGS) IMPLANT
GLOVE BIO SURGEON STRL SZ 6 (GLOVE) ×1 IMPLANT
GLOVE BIOGEL PI IND STRL 6 (GLOVE) ×2 IMPLANT
GLOVE BIOGEL PI IND STRL 7.0 (GLOVE) ×1 IMPLANT
GOWN STRL REUS W/TWL LRG LVL3 (GOWN DISPOSABLE) ×2 IMPLANT
KIT ABG SYR 3ML LUER SLIP (SYRINGE) ×1 IMPLANT
NDL HYPO 25X5/8 SAFETYGLIDE (NEEDLE) ×1 IMPLANT
NEEDLE HYPO 25X5/8 SAFETYGLIDE (NEEDLE) ×1 IMPLANT
NS IRRIG 1000ML POUR BTL (IV SOLUTION) ×1 IMPLANT
PACK C SECTION WH (CUSTOM PROCEDURE TRAY) ×1 IMPLANT
PAD OB MATERNITY 4.3X12.25 (PERSONAL CARE ITEMS) ×1 IMPLANT
STRIP CLOSURE SKIN 1/2X4 (GAUZE/BANDAGES/DRESSINGS) IMPLANT
SUT CHROMIC 0 CTX 36 (SUTURE) ×3 IMPLANT
SUT MON AB 2-0 CT1 27 (SUTURE) ×1 IMPLANT
SUT PDS AB 0 CT1 27 (SUTURE) IMPLANT
SUT PLAIN 0 NONE (SUTURE) IMPLANT
SUT VIC AB 0 CT1 36 (SUTURE) IMPLANT
SUT VIC AB 4-0 KS 27 (SUTURE) IMPLANT
TAPE CLOTH SURG 4X10 WHT LF (GAUZE/BANDAGES/DRESSINGS) IMPLANT
TOWEL OR 17X24 6PK STRL BLUE (TOWEL DISPOSABLE) ×1 IMPLANT
TRAY FOLEY W/BAG SLVR 14FR LF (SET/KITS/TRAYS/PACK) IMPLANT
WATER STERILE IRR 1000ML POUR (IV SOLUTION) ×1 IMPLANT

## 2022-08-04 NOTE — Anesthesia Procedure Notes (Signed)
Spinal  Patient location during procedure: OR Start time: 08/04/2022 7:44 AM End time: 08/04/2022 7:47 AM Reason for block: surgical anesthesia Staffing Performed: anesthesiologist  Anesthesiologist: Pervis Hocking, DO Performed by: Pervis Hocking, DO Authorized by: Pervis Hocking, DO   Preanesthetic Checklist Completed: patient identified, IV checked, risks and benefits discussed, surgical consent, monitors and equipment checked, pre-op evaluation and timeout performed Spinal Block Patient position: sitting Prep: DuraPrep and site prepped and draped Patient monitoring: cardiac monitor, continuous pulse ox and blood pressure Approach: midline Location: L3-4 Injection technique: single-shot Needle Needle type: Pencan  Needle gauge: 24 G Needle length: 9 cm Assessment Sensory level: T6 Events: CSF return Additional Notes Functioning IV was confirmed and monitors were applied. Sterile prep and drape, including hand hygiene and sterile gloves were used. The patient was positioned and the spine was prepped. The skin was anesthetized with lidocaine.  Free flow of clear CSF was obtained prior to injecting local anesthetic into the CSF.  The spinal needle aspirated freely following injection.  The needle was carefully withdrawn.  The patient tolerated the procedure well.

## 2022-08-04 NOTE — Progress Notes (Signed)
Hypoglycemic Event  CBG: 66  Treatment: D50 25 mL (12.5 gm)  Symptoms: None  Follow-up CBG: Time:0233 CBG Result:112  Possible Reasons for Event: Inadequate meal intake  Comments/MD notified:Dr. Mardelle Matte notified. Instructed to repeat hypoglycemic protocol if CBGs fall under 70.    Sunday Shams

## 2022-08-04 NOTE — Anesthesia Postprocedure Evaluation (Signed)
Anesthesia Post Note  Patient: Ashley Hester  Procedure(s) Performed: REPEAT CESAREAN SECTION EDC: 08-25-22 ALLERG: NKDA  PREVIOUS X 1 (Abdomen)     Patient location during evaluation: PACU Anesthesia Type: Spinal Level of consciousness: awake and alert and oriented Pain management: pain level controlled Vital Signs Assessment: post-procedure vital signs reviewed and stable Respiratory status: spontaneous breathing, nonlabored ventilation and respiratory function stable Cardiovascular status: blood pressure returned to baseline and stable Postop Assessment: no headache, no backache, spinal receding, patient able to bend at knees and no apparent nausea or vomiting Anesthetic complications: no   No notable events documented.  Last Vitals:  Vitals:   08/04/22 0930 08/04/22 0931  BP: (!) 90/48 110/60  Pulse: 80 80  Resp: 19 14  Temp:    SpO2: 97% 97%    Last Pain:  Vitals:   08/04/22 1005  TempSrc:   PainSc: 0-No pain                 Pervis Hocking

## 2022-08-04 NOTE — Op Note (Signed)
Ashley Hester PROCEDURE DATE: 07/24/2022 - 08/04/2022  PREOPERATIVE DIAGNOSIS: Intrauterine pregnancy at  38w0dweeks gestation, previous Cesarean Section, Poorly controlled T1DM  POSTOPERATIVE DIAGNOSIS: The same  PROCEDURE:  Repeat Low Transverse Cesarean Section  SURGEON:  Dr. MLinda Hedges INDICATIONS: Ashley CZARNIKis a 32y.o. [phone] 317w0dcheduled for cesarean section secondary to poorly controlled T1DM, previous Cesarean Delivery.  The risks of cesarean section discussed with the patient included but were not limited to: bleeding which may require transfusion or reoperation; infection which may require antibiotics; injury to bowel, bladder, ureters or other surrounding organs; injury to the fetus; need for additional procedures including hysterectomy in the event of a life-threatening hemorrhage; placental abnormalities wth subsequent pregnancies, incisional problems, thromboembolic phenomenon and other postoperative/anesthesia complications. The patient concurred with the proposed plan, giving informed written consent for the procedure.    FINDINGS:  Viable female infant in cephalic presentation, APGARs 7,8: weight pending  Clear amniotic fluid.  Intact placenta, three vessel cord.  Grossly normal uterus, ovaries and fallopian tubes.  ANESTHESIA:  Spinal ESTIMATED BLOOD LOSS: 470 ml SPECIMENS: Placenta sent to L&D COMPLICATIONS: None immediate  PROCEDURE IN DETAIL:  The patient received intravenous antibiotics and had sequential compression devices applied to her lower extremities while in the preoperative area.  She was then taken to the operating room where spinal anesthesia was administered and was found to be adequate. She was then placed in a dorsal supine position with a leftward tilt, and prepped and draped in a sterile manner.  A foley catheter was placed into her bladder and attached to constant gravity.  After an adequate timeout was performed, a Pfannenstiel skin  incision was made with scalpel and carried through to the underlying layer of fascia. The fascia was incised in the midline and this incision was extended bilaterally using the Mayo scissors. Kocher clamps were applied to the superior aspect of the fascial incision and the underlying rectus muscles were dissected off bluntly. A similar process was carried out on the inferior aspect of the facial incision. The rectus muscles were separated in the midline bluntly and the peritoneum was entered bluntly.   A transverse hysterotomy was made with a scalpel and extended bilaterally bluntly. The bladder blade was then removed. The infant was successfully delivered, and cord was clamped and cut and infant was handed over to awaiting neonatology team. Uterine massage was then administered and the placenta delivered intact with three-vessel cord. The uterus was cleared of clot and debris.  The hysterotomy was closed with 0 chromic.  A second imbricating suture of 0-chromic was used to reinforce the incision and aid in hemostasis.  The peritoneum and rectus muscles were noted to be hemostatic and were reapproximated using 2-0 monocryl in a running fashion.  The fascia was closed with 0-PDS in a running fashion with good restoration of anatomy.  The subcutaneus tissue was copiously irrigated.  The skin was closed with 4-0 vicryl in a subcuticular fashion.  Pt tolerated the procedure will.  All counts were correct x2.  Pt went to the recovery room in stable condition.

## 2022-08-04 NOTE — Transfer of Care (Signed)
Immediate Anesthesia Transfer of Care Note  Patient: Ashley Hester  Procedure(s) Performed: REPEAT CESAREAN SECTION EDC: 08-25-22 ALLERG: NKDA  PREVIOUS X 1 (Abdomen)  Patient Location: PACU  Anesthesia Type:Spinal  Level of Consciousness: awake  Airway & Oxygen Therapy: Patient Spontanous Breathing  Post-op Assessment: Report given to RN and Post -op Vital signs reviewed and stable  Post vital signs: Reviewed and stable  Last Vitals:  Vitals Value Taken Time  BP 112/68 08/04/22 0915  Temp 36.4 C 08/04/22 0908  Pulse 91 08/04/22 0917  Resp 22 08/04/22 0917  SpO2 97 % 08/04/22 0917  Vitals shown include unvalidated device data.  Last Pain:  Vitals:   08/04/22 0908  TempSrc:   PainSc: 0-No pain      Patients Stated Pain Goal: 1 ([id number])  Complications: No notable events documented.

## 2022-08-04 NOTE — Progress Notes (Signed)
Patient has been NPO since MN.  She reports feeling well.  Active FM.     08/04/2022    4:47 AM 08/03/2022   11:05 PM 08/03/2022    7:17 PM  Vitals with BMI  Systolic [id number]  Diastolic 54 55 62  Pulse 81 91 94     CBG (last 3)  Recent Labs    08/04/22 0234 08/04/22 0308 08/04/22 0611  GLUCAP 122* 94 87      Latest Ref Rng & Units 08/03/2022    9:09 PM 08/01/2022   11:15 PM 07/28/2022   11:11 AM  CBC  WBC 4.0 - 10.5 K/uL 9.5  9.4  7.7   Hemoglobin 12.0 - 15.0 g/dL 10.5  10.5  10.4   Hematocrit 36.0 - 46.0 % 31.9  30.4  30.1   Platelets 150 - 400 K/uL 219  204  181       Latest Ref Rng & Units 08/03/2022    9:09 PM 08/01/2022   11:15 PM 07/28/2022   11:11 AM  CMP  Glucose 70 - 99 mg/dL 142  105  93   BUN 6 - 20 mg/dL 6  <5  6   Creatinine 0.44 - 1.00 mg/dL 0.77  0.71  0.75   Sodium 135 - 145 mmol/L 134  135  134   Potassium 3.5 - 5.1 mmol/L 3.7  3.4  3.7   Chloride 98 - 111 mmol/L 104  105  103   CO2 22 - 32 mmol/L 21  21  22   $ Calcium 8.9 - 10.3 mg/dL 9.4  8.9  8.9   Total Protein 6.5 - 8.1 g/dL 5.9  5.5  5.4   Total Bilirubin 0.3 - 1.2 mg/dL 0.4  0.4  0.5   Alkaline Phos 38 - 126 U/L 110  102  103   AST 15 - 41 U/L 26  21  22   $ ALT 0 - 44 U/L 21  20  24    $ Gen: A&O x 3 Abd: soft, NT Ext: no c/c/e  NST reactive yesterday  31yo [id number] at 46w0dwith T1DM admitted for glycemic control  Planning repeat C/S this AM.  She is counseled re: risk of bleeding, infection, scarring and damage to surrounding structures.  She is informed of implications in future pregnancies to include abnormal placentation and uterine rupture.  All questions were answered and patient wishes to proceed.  MLinda Hedges DO

## 2022-08-05 LAB — CBC
HCT: 28.1 % — ABNORMAL LOW (ref 36.0–46.0)
Hemoglobin: 9.6 g/dL — ABNORMAL LOW (ref 12.0–15.0)
MCH: 31 pg (ref 26.0–34.0)
MCHC: 34.2 g/dL (ref 30.0–36.0)
MCV: 90.6 fL (ref 80.0–100.0)
Platelets: 184 10*3/uL (ref 150–400)
RBC: 3.1 MIL/uL — ABNORMAL LOW (ref 3.87–5.11)
RDW: 14.1 % (ref 11.5–15.5)
WBC: 12.6 10*3/uL — ABNORMAL HIGH (ref 4.0–10.5)
nRBC: 0 % (ref 0.0–0.2)

## 2022-08-05 LAB — COMPREHENSIVE METABOLIC PANEL WITH GFR
ALT: 19 U/L (ref 0–44)
AST: 26 U/L (ref 15–41)
Albumin: 2 g/dL — ABNORMAL LOW (ref 3.5–5.0)
Alkaline Phosphatase: 79 U/L (ref 38–126)
Anion gap: 8 (ref 5–15)
BUN: 6 mg/dL (ref 6–20)
CO2: 23 mmol/L (ref 22–32)
Calcium: 8.9 mg/dL (ref 8.9–10.3)
Chloride: 103 mmol/L (ref 98–111)
Creatinine, Ser: 0.79 mg/dL (ref 0.44–1.00)
GFR, Estimated: >60 mL/min
Glucose, Bld: 100 mg/dL — ABNORMAL HIGH (ref 70–99)
Potassium: 4.1 mmol/L (ref 3.5–5.1)
Sodium: 134 mmol/L — ABNORMAL LOW (ref 135–145)
Total Bilirubin: 0.8 mg/dL (ref 0.3–1.2)
Total Protein: 4.9 g/dL — ABNORMAL LOW (ref 6.5–8.1)

## 2022-08-05 LAB — GLUCOSE, CAPILLARY
Glucose-Capillary: 152 mg/dL — ABNORMAL HIGH (ref 70–99)
Glucose-Capillary: 147 mg/dL — ABNORMAL HIGH (ref 70–99)

## 2022-08-05 MED ORDER — INSULIN PUMP
Freq: Three times a day (TID) | SUBCUTANEOUS | Status: DC
  Administered 2022-08-05: 3.88 via SUBCUTANEOUS
  Administered 2022-08-05: 2.8 via SUBCUTANEOUS
  Filled 2022-08-05: qty 1

## 2022-08-05 NOTE — Progress Notes (Signed)
Patient screened out for psychosocial assessment since none of the following apply: Psychosocial stressors documented in mother or baby's chart Gestation less than 32 weeks Code at delivery  Infant with anomalies Please contact the Clinical Social Worker if specific needs arise or by MOB's request.  Flavia Shipper Postpartum Depression Score is 0.  Laurey Arrow, MSW, LCSW Clinical Social Work [phone]

## 2022-08-05 NOTE — Progress Notes (Signed)
I spoke to diabetic coordinator Barnie Alderman, who will see Ashley Hester today and review orders.

## 2022-08-05 NOTE — Inpatient Diabetes Management (Addendum)
Inpatient Diabetes Program Recommendations  AACE/ADA: New Consensus Statement on Inpatient Glycemic Control   Target Ranges:  Prepandial:   less than 140 mg/dL      Peak postprandial:   less than 180 mg/dL (1-2 hours)      Critically ill patients:  140 - 180 mg/dL    Latest Reference Range & Units 08/04/22 01:50 08/04/22 02:33 08/04/22 02:34 08/04/22 03:08 08/04/22 06:11 08/04/22 07:39 08/04/22 08:31 08/04/22 09:11  Glucose-Capillary 70 - 99 mg/dL 66 (L) 112 (H) 122 (H) 94 87 107 (H) 137 (H) 165 (H)   Review of Glycemic Control  Diabetes history: DM1 Outpatient Diabetes medications: T-Slim insulin pump Current orders for Inpatient glycemic control: None  Inpatient Diabetes Program Recommendations:    Insulin Pump: Please order insulin pump insulin pump order set with CBGs AC&HS and 2am.   Diet: Agree with ordering Regular diet.  NOTE: Received call from [place], RN regarding patient using insulin pump. Betsy, RN notes that patient did make pump changes following delivery on 08/04/22 and CBGs have been trending well.  Bronson Curb, RN, Diabetes Coordinator has note in chart on 07/31/22 which notes "Patient has temp settings in Tslim pump from endocrinology (basal 1.0 units/hr, CR 1:15, SF 1:50)."  Will place orders for insulin pump order set with permission from Dr. Corinna Capra and will plan to see patient today to verify insulin pump settings.  Addendum 08/05/22@11$ :18-Spoke with patient at bedside. Patient is currently breastfeeding and CGM glucose is 115 mg/dl currently. Patient states her insulin pump is in Control IQ (auto mode).  Patient states she changed over to post delivery settings follow delivery on 08/04/22. She pulled up insulin pump settings which are: Basal 1 unit/hour Insulin Carb Ratio 1:15 grams Insulin Sensitivity Factor 1:50 mg/dl Patient states that she has not eaten a meal since delivery until she ate breakfast today. She thinks that she may need to adjust carb ratio to  more like 1 unit per 12-13 grams. Discussed effect of breastfeeding on glucose and encouraged patient to wait and see how glucose does today after meals and if it remains consistently elevated, she will adjust to 1 unit per 13 grams of carbs. Patient asking if she can have a regular diet. Agree with patient getting regular diet so she can continue to adjust insulin settings if needed based on PO intake. Patient verbalized understanding and states she has no questions or concerns at this time.  Thanks, Barnie Alderman, RN, MSN, [place] Diabetes Coordinator Inpatient Diabetes Program [phone] (Team Pager from 8am to [place])

## 2022-08-05 NOTE — Lactation Note (Addendum)
This note was copied from a baby's chart.  NICU Lactation Consultation Note  Patient Name: Ashley Hester [id number] Date: 08/05/2022 Age:31 hours   Subjective Reason for consult: Initial assessment; 1st time breastfeeding; Early term 37-38.6wks; Maternal endocrine disorder; Breastfeeding assistance  LC in to visit with P2 Mom and FOB of ET infant in the NICU.  Baby "Celine Ahr" received respiratory support following delivery by C/Section.  Baby had PIV for low blood sugars as Mom is an insulin dependent type 1 diabetic.    Mom started pumping yesterday and has been every 2 hrs.  Mom had a poor experience with breastfeeding her first baby (34 wks) who was in the NICU and Mom was in ICU and due to stressors, her milk volume was low and Mom stopped pumping after a week.    Baby lost his PIV and had to have his NG tube replaced this am.  Baby is on room air and allowed to try to breastfeed.  Baby placed STS on Mom's chest.  LC let baby rest and recover, watching for feeding cues.   Reviewed breast massage and hand expression, drop of colostrum noted.  With Mom reclined, gently moved baby while prone, onto Mom's breast with the drop of colostrum.  Baby opened his mouth and LC tugged on his chin for a wide gape.  Baby did not suck, but remained on the breast sleeping contently for 30 mins.  RN started the NG feeding at beginning of latch assist (due to glucose stabilization needs)  Provided a hand's free pumping band (large) to help Mom be able to pump while baby STS.  Mom did not want to move baby to warmer yet and it had been 5 hrs since she pumped.  Mom to call her RN when baby starts showing feeding cues, and LC will try to attend. Mom very committed to exclusive breastfeeding.   Objective Infant data: Mother's Current Feeding Choice: Breast Milk and Donor Milk  Infant feeding assessment Scale for Readiness: 3   Maternal data: [id number]  C-Section, Low Transverse Significant Breast  History:: ++ breast changes  No data recorded Previous breastfeeding challenges?: Lack of support; Other (Comment) (Mom intubated in ICU after delivery and baby in NICU)  Does the patient have breastfeeding experience prior to this delivery?: Yes How long did the patient breastfeed?: 1 week  Pumping frequency: Every 2 hrs Pumped volume: 0 mL (drops) Flange Size: 21  Risk factor for low milk supply:: Infant separation/NICU  Pump: Hands Free, Personal (MomCozy)  Assessment Infant: LATCH Score: 5 Baby contented being STS and sleeping on Mom's chest.  Maternal: Milk volume: Normal   Intervention/Plan Interventions: Breast feeding basics reviewed; Assisted with latch; Skin to skin; Breast massage; Hand express; Adjust position; Support pillows; Position options; Coconut oil; DEBP; Education; [employer] brochure  Tools: Pump; Flanges; Coconut oil; Hands-free pumping top Pump Education: Setup, frequency, and cleaning; Milk Storage  Plan: 1- Encourage STS as much as possible 2- Offer the breast with feeding cues, asking for help prn 3- Pump consistently every 2-3 hrs when awake  Consult Status: NICU follow-up  NICU Follow-up type: New admission follow up    Ashley Hester 08/05/2022, 11:44 AM

## 2022-08-05 NOTE — Progress Notes (Signed)
Subjective: Postpartum Day 1: Cesarean Delivery Patient reports tolerating PO.   Reports glc levels 82 -115 Wants regular diet Objective: Vital signs in last 24 hours: Temp:  [97.9 F (36.6 C)-98.7 F (37.1 C)] 98.7 F (37.1 C) (02/21 0915) Pulse Rate:  [60-80] 73 (02/21 0915) Resp:  [16-18] 16 (02/21 0915) BP: (103-114)/(55-68) 103/55 (02/21 0915) SpO2:  [96 %-98 %] 96 % (02/21 0915)  Physical Exam:  General: alert, cooperative, appears stated age, and no distress Lochia: appropriate Uterine Fundus: firm Incision: bandage dry DVT Evaluation: No evidence of DVT seen on physical exam.  Recent Labs    08/03/22 2109 08/05/22 0619  HGB 10.5* 9.6*  HCT 31.9* 28.1*    Assessment/Plan: Status post Cesarean section. Postoperative course complicated by diabetes in good control on pump   Would like diabetic coordinator to check in on her and make sure settings and testing is appropriate due to her past hx of PP DKA Baby in no circ due to hypospadias.  Luz Lex, MD 08/05/2022, 10:25 AM

## 2022-08-05 NOTE — Progress Notes (Signed)
Dr Corinna Capra is notified that the Ashley Hester fainted in the shower. She was safely seated on the shower seat. Ammonia salt is used. Mr Benish checked patient's glucose monitor. Glucose was 79. Pt was safely transferred to bed via wheel chair. VS BP 115/64, HR 76, RR 16, temp 98.5. No new orders received from Dr Corinna Capra.

## 2022-08-05 NOTE — Progress Notes (Signed)
Reviewed pt status and plan of care with DR Corinna Capra. Dr Corinna Capra requests I contact diabetic coordinator for insulin pump, CBG and diet orders.

## 2022-08-06 LAB — GLUCOSE, CAPILLARY
Glucose-Capillary: 140 mg/dL — ABNORMAL HIGH (ref 70–99)
Glucose-Capillary: 124 mg/dL — ABNORMAL HIGH (ref 70–99)

## 2022-08-06 NOTE — Progress Notes (Signed)
POD # 2  S:  patient is still having some pain - reluctant to try oxycodone. She is tweaking her insulin pump.  O:  BP 114/65 (BP Location: Right Arm)   Pulse 71   Temp 98.3 F (36.8 C) (Oral)   Resp 15   Ht 5' 4"$  (1.626 m)   Wt 88.4 kg   LMP 11/18/2021   SpO2 97%   Breastfeeding Unknown   BMI 33.44 kg/m  Results for orders placed or performed during the hospital encounter of 07/24/22 (from the past 24 hour(s))  Glucose, capillary     Status: Abnormal   Collection Time: 08/05/22 12:00 PM  Result Value Ref Range   Glucose-Capillary 152 (H) 70 - 99 mg/dL  Glucose, capillary     Status: Abnormal   Collection Time: 08/05/22  9:54 PM  Result Value Ref Range   Glucose-Capillary 147 (H) 70 - 99 mg/dL   Scheduled Meds:  acetaminophen  1,000 mg Oral Q6H   ferrous sulfate  325 mg Oral Q breakfast   ibuprofen  600 mg Oral Q6H   insulin pump   Subcutaneous TID WC, HS, 0200   senna-docusate  2 tablet Oral Daily   simethicone  80 mg Oral TID PC   Continuous Infusions: PRN Meds:.coconut oil, witch hazel-glycerin **AND** dibucaine, diphenhydrAMINE, menthol-cetylpyridinium, ondansetron, oxyCODONE, simethicone, zolpidem  Uterus is firm and non tender  Bandage is clean and dry   Impression: POD # 2  Type 1 DM  PLAN: Encourage pain control with meds - reviewed with the patient Patient is titrating/managing her own pump

## 2022-08-06 NOTE — Lactation Note (Signed)
This note was copied from a baby's chart. Lactation Consultation Note  Patient Name: Ashley Hester [id number] Date: 08/06/2022 Reason for consult: Follow-up assessment;Early term 37-38.6wks;Maternal endocrine disorder;1st time breastfeeding;NICU baby Age:31 hours  Couplet care RN Ashley Hester called this Ashley Hester because patient was still experiencing nipple sensitivity. Ashley Hester tried the # 19 flanges/inserts on her last pumping sessions and voiced her pain has improved since then. She also reported that baby went to breast and the initial latch felt painful (probably due to transient soreness) but the pain went away as the feeding progressed. Provided breast shells to use in between pumping/feeding sessions. Continue current plan of care. Ashley Hester present and supportive. All questions and concerns answered, family to contact [facility] services PRN.   Feeding Mother's Current Feeding Choice: Breast Milk and Donor Milk  LATCH Score Latch: Grasps breast easily, tongue down, lips flanged, rhythmical sucking.  Audible Swallowing: A few with stimulation  Type of Nipple: Everted at rest and after stimulation  Comfort (Breast/Nipple): Soft / non-tender  Hold (Positioning): Assistance needed to correctly position infant at breast and maintain latch.  LATCH Score: 8   Lactation Tools Discussed/Used Tools: Pump;Flanges;Hands-free pumping top;Coconut oil (Size "L") Flange Size: 21 (but needs smaller size) Breast pump type: Double-Electric Breast Pump Pump Education: Setup, frequency, and cleaning;Milk Storage Reason for Pumping: ETI in NICU Pumping frequency: 6 times/24 hours Pumped volume:  (droplets)  Interventions Interventions: Breast feeding basics reviewed;DEBP;Education;Infant Driven Feeding Algorithm education  Discharge Pump: Personal;Stork Pump (Mom Cozy and [employer])  Consult Status Consult Status: NICU follow-up Date: 08/06/22 Follow-up type: In-patient    Ashley Hester 08/06/2022, 3:14 PM

## 2022-08-06 NOTE — Lactation Note (Signed)
This note was copied from a baby's chart.  NICU Lactation Consultation Note  Patient Name: Ashley Hester [id number] Date: 08/06/2022 Age:31 hours  Subjective Reason for consult: Follow-up assessment; Early term 37-38.6wks; Maternal endocrine disorder; 1st time breastfeeding; NICU baby  Visited with family of 21 hours old ETI NICU female; Ashley Hester is a P2 and reports she's pumping consistently but she's only getting from droplets to just "moisture" on the flanges. Explained that the purpose of pumping this early on is mainly for breast stimulation and not to get volume. She voiced some nipple discomfort, and noticed that the # 21 flanges are drawing too much areolar tissue; she has smaller inserts at home # 17 and 19) that she would like to try; asked her to try the # 19 and let lactation know if there is an improvement.  Reviewed pumping schedule, lactogenesis II/III, benefits of STS care, IDF 1/2 and anticipatory guidelines. Left mom and baby doing STS when exiting the room, she voiced she's also doing some "lick & learn".  Objective Infant data: Mother's Current Feeding Choice: Breast Milk and Donor Milk  Infant feeding assessment Scale for Readiness: 5 (RR 84; consistently taking paci)  Maternal data: [id number]  C-Section, Low Transverse Significant Breast History:: ++ breast changes  Current breast feeding challenges:: NICU admission  Previous breastfeeding challenges?: Lack of support; Other (Comment) (Mom intubated in ICU after delivery and baby in NICU)  Does the patient have breastfeeding experience prior to this delivery?: Yes How long did the patient breastfeed?: 1 week  Pumping frequency: 6 times/24 hours Pumped volume: 0 mL (droplets) Flange Size: 21 (but needs smaller size)  Risk factor for low milk supply:: Infant separation/NICU   Pump: Personal, Stork Pump (Mom Cozy and Facilities manager)  Assessment Infant: LATCH Score: 6  Maternal: Milk volume:  Normal  Intervention/Plan Interventions: Breast feeding basics reviewed; DEBP; Education; Infant Driven Feeding Algorithm education  Tools: Pump; Flanges; Hands-free pumping top; Coconut oil (Size "L") Pump Education: Setup, frequency, and cleaning; Milk Storage  Plan of care: Encouraged bilateral pumping every 3 hours, ideally 8 pumping sessions/24 hours. Breast massage, hand expression and coconut oil were also encouraged prior pumping She'll continue taking baby to breast on feeding cues around feeding time for "lick & learn".   Ashley Hester present and supportive. All questions and concerns answered, family to contact [facility] services PRN.  Consult Status: NICU follow-up  NICU Follow-up type: Maternal D/C visit; Verify onset of copious milk   Ashley Hester 08/06/2022, 12:41 PM

## 2022-08-07 LAB — COMPREHENSIVE METABOLIC PANEL
ALT: 16 U/L (ref 0–44)
AST: 16 U/L (ref 15–41)
Albumin: 2 g/dL — ABNORMAL LOW (ref 3.5–5.0)
Alkaline Phosphatase: 80 U/L (ref 38–126)
Anion gap: 8 (ref 5–15)
BUN: 6 mg/dL (ref 6–20)
CO2: 22 mmol/L (ref 22–32)
Calcium: 9 mg/dL (ref 8.9–10.3)
Chloride: 108 mmol/L (ref 98–111)
Creatinine, Ser: 0.82 mg/dL (ref 0.44–1.00)
GFR, Estimated: >60 mL/min (ref >60)
Glucose, Bld: 132 mg/dL — ABNORMAL HIGH (ref 70–99)
Potassium: 3.8 mmol/L (ref 3.5–5.1)
Sodium: 138 mmol/L (ref 135–145)
Total Bilirubin: 0.4 mg/dL (ref 0.3–1.2)
Total Protein: 5.2 g/dL — ABNORMAL LOW (ref 6.5–8.1)

## 2022-08-07 MED ORDER — IBUPROFEN 600 MG PO TABS: 600.0000 mg | ORAL_TABLET | Freq: Four times a day (QID) | ORAL | 0 refills | Status: DC

## 2022-08-07 MED ORDER — OXYCODONE HCL 5 MG PO TABS: 5.0000 mg | ORAL_TABLET | ORAL | 0 refills | Status: DC | PRN

## 2022-08-07 MED ORDER — SENNOSIDES-DOCUSATE SODIUM 8.6-50 MG PO TABS: 2.0000 | ORAL_TABLET | Freq: Every day | ORAL | 1 refills | Status: DC

## 2022-08-07 MED ORDER — ACETAMINOPHEN 500 MG PO TABS: 1000.0000 mg | ORAL_TABLET | Freq: Four times a day (QID) | ORAL | 0 refills | Status: AC

## 2022-08-07 NOTE — Discharge Summary (Signed)
Postpartum Discharge Summary  Date of Service updated 08/07/22     Patient Name: Ashley Hester DOB: 1990-12-28 MRN: [id number]  Date of admission: 07/24/2022 Delivery date:08/04/2022  Delivering provider: Linda Hedges  Date of discharge: 08/07/2022  Admitting diagnosis: Diabetes in pregnancy [O24.919] Poor glycemic control [R73.09] Previous cesarean section [Z98.891] S/P cesarean section [Z98.891] Intrauterine pregnancy: [redacted]w[redacted]d    Secondary diagnosis:  Principal Problem:   Diabetes in pregnancy Active Problems:   Poor glycemic control   Previous cesarean section   S/P cesarean section  Additional problems: T1DM, Hx of DKA, Hx of atypical preeclampsia   Discharge diagnosis: Term Pregnancy Delivered (remainder as above)                                           Post partum procedures: none Augmentation: N/A Complications: None  Hospital course: Scheduled C/S   32y.o. yo [id number] admitted to the hospital 07/24/2022 for scheduled cesarean section with the following indication:Elective Repeat. Delivery details are as follows:  Membrane Rupture Time/Date: 8:08 AM ,08/04/2022   Delivery Method:C-Section, Low Transverse  Details of operation can be found in separate operative note.  Patient had a postpartum course complicated by none. Her glycemic control has been wnl for postpartum goals.  She is ambulating, tolerating a regular diet, passing flatus, and urinating well. Patient is discharged home in stable condition on  08/07/22. She will follow up with her endocrinologist to ensure pump settings are appropriate.        Newborn Data: Birth date:08/04/2022  Birth time:8:09 AM  Gender:Female  Living status:Living  Apgars:7 ,8  Weight:4170 g     Magnesium Sulfate received: No BMZ received: No Rhophylac:No MMR:No T-DaP:Given prenatally Flu: No Transfusion:No  Physical exam  Vitals:   08/06/22 0803 08/06/22 1634 08/07/22 0114 08/07/22 0822  BP: 114/65 118/66  112/74 116/71  Pulse: 71 83 68 76  Resp: '15 17 16 18  '$ Temp: 98.3 F (36.8 C) 98.4 F (36.9 C) 98 F (36.7 C) 98.6 F (37 C)  TempSrc: Oral Oral Oral Oral  SpO2: 97% 98% 97%   Weight:      Height:       General: alert, cooperative, and no distress Lochia: appropriate Uterine Fundus: firm Incision: Healing well with no significant drainage DVT Evaluation: No evidence of DVT seen on physical exam. Labs: Lab Results  Component Value Date   WBC 12.6 (H) 08/05/2022   HGB 9.6 (L) 08/05/2022   HCT 28.1 (L) 08/05/2022   MCV 90.6 08/05/2022   PLT 184 08/05/2022      Latest Ref Rng & Units 08/07/2022    7:57 AM  CMP  Glucose 70 - 99 mg/dL 132   BUN 6 - 20 mg/dL 6   Creatinine 0.44 - 1.00 mg/dL 0.82   Sodium 135 - 145 mmol/L 138   Potassium 3.5 - 5.1 mmol/L 3.8   Chloride 98 - 111 mmol/L 108   CO2 22 - 32 mmol/L 22   Calcium 8.9 - 10.3 mg/dL 9.0   Total Protein 6.5 - 8.1 g/dL 5.2   Total Bilirubin 0.3 - 1.2 mg/dL 0.4   Alkaline Phos 38 - 126 U/L 80   AST 15 - 41 U/L 16   ALT 0 - 44 U/L 16    Edinburgh Score:    08/04/2022    3:45 PM  Edinburgh Postnatal Depression Scale Screening Tool  I have been able to laugh and see the funny side of things. 0  I have looked forward with enjoyment to things. 0  I have blamed myself unnecessarily when things went wrong. 0  I have been anxious or worried for no good reason. 0  I have felt scared or panicky for no good reason. 0  Things have been getting on top of me. 0  I have been so unhappy that I have had difficulty sleeping. 0  I have felt sad or miserable. 0  I have been so unhappy that I have been crying. 0  The thought of harming myself has occurred to me. 0  Edinburgh Postnatal Depression Scale Total 0      After visit meds:  Allergies as of 08/07/2022   No Known Allergies      Medication List     STOP taking these medications    aspirin EC 81 MG tablet       TAKE these medications    acetaminophen 500 MG  tablet Commonly known as: TYLENOL Take 2 tablets (1,000 mg total) by mouth every 6 (six) hours.   ferrous sulfate 325 (65 FE) MG EC tablet Take 1 tablet every day by oral route.   HumaLOG 100 UNIT/ML injection Generic drug: insulin lispro SMARTSIG:0-50 Unit(s) SUB-Q   ibuprofen 600 MG tablet Commonly known as: ADVIL Take 1 tablet (600 mg total) by mouth every 6 (six) hours.   insulin pump Soln Inject into the skin.   oxyCODONE 5 MG immediate release tablet Commonly known as: Oxy IR/ROXICODONE Take 1 tablet (5 mg total) by mouth every 4 (four) hours as needed for moderate pain.   prenatal multivitamin Tabs tablet Take 1 tablet by mouth daily at 12 noon.   senna-docusate 8.6-50 MG tablet Commonly known as: Senokot-S Take 2 tablets by mouth daily.         Discharge home in stable condition Infant Feeding: Breast Infant Disposition:NICU Discharge instruction: per After Visit Summary and Postpartum booklet. Activity: Advance as tolerated. Pelvic rest for 6 weeks.  Diet: carb control diet Anticipated Birth Control: Unsure Postpartum Appointment:6 weeks Additional Postpartum F/U:  none Future Appointments: Future Appointments  Date Time Provider [place]  08/21/2022  3:00 PM Freada Bergeron, MD [facility] None     08/07/2022 Charlotta Newton, MD

## 2022-08-07 NOTE — Lactation Note (Addendum)
This note was copied from a baby's chart. Lactation Consultation Note  Patient Name: Ashley Hester [id number] Date: 08/07/2022 Reason for consult: Follow-up assessment;Early term 37-38.6wks;1st time breastfeeding;NICU baby;Mother's request;RN request;Hyperbilirubinemia Age:31 hours  P2, Baby 37 weeks.  Baby had come off breast prior to [facility] entering room with a few mintues latched.  Mother's breasts are filling. Recommend mother hand express or prepump before latching.  Assisted with latching on both breasts. Turned baby tummy to tummy and guided baby deep on breast.  Baby tired easily at the breast.  Mother complaining of continued nipple tenderness with pumping and low volume when pumping.  Applied warm, moist cloths to both breasts, lubricated 21 flanges with nipple balm and hand expressed before pumping.  Mother pumped 5 ml.  Suggest pumping again in 2-2.5 hours.  Suggest applying comfort gels to sore nipples after feeding or pumping for comfort.  Also suggest "pumpin pal" flanges if pain continues.  Mother will continue to offer breast prior to pumping.   Maternal Data Has patient been taught Hand Expression?: Yes  Feeding Mother's Current Feeding Choice: Breast Milk and Donor Milk  LATCH Score Latch: Repeated attempts needed to sustain latch, nipple held in mouth throughout feeding, stimulation needed to elicit sucking reflex.  Audible Swallowing: A few with stimulation  Type of Nipple: Everted at rest and after stimulation  Comfort (Breast/Nipple): Filling, red/small blisters or bruises, mild/mod discomfort  Hold (Positioning): Assistance needed to correctly position infant at breast and maintain latch.  LATCH Score: 6   Lactation Tools Discussed/Used Tools: Pump;Flanges;Comfort gels (personal nipple balm) Pumping frequency: 8 times in 24 hours Pumped volume: 5 mL  Interventions Interventions: Assisted with latch;Skin to skin;Hand express;Pre-pump if needed;Breast  compression;Adjust position;DEBP;Education Consult Status Consult Status: NICU follow-up    Vivianne Master [facility] 08/07/2022, 11:29 AM

## 2022-08-07 NOTE — Plan of Care (Signed)
Patient verbalized understanding of discharge instructions, with teach back. Given opportunity to ask questions.

## 2022-08-07 NOTE — Progress Notes (Signed)
Discharged to boarding in stable condition.

## 2022-08-08 ENCOUNTER — Ambulatory Visit: Payer: Self-pay

## 2022-08-08 NOTE — Lactation Note (Signed)
This note was copied from a baby's chart.  NICU Lactation Consultation Note  Patient Name: Boy Zahara Meeder [id number] Date: 08/08/2022 Age:31 days   Subjective Reason for consult: Follow-up assessment; 1st time breastfeeding; NICU baby; Early term 37-38.6wks; Maternal endocrine disorder  LC assisted with another latch to the breast.  Mom iced her breasts for 15 mins.  Baby started cueing and Mom asked for help with another feeding.  [place] talked about doing an SNS at the breast to encourage baby to stay nutritively sucking for longer.  Mom's breasts are still full, last pumping she expressed 10 ml.  Baby latched to left breast in football hold for 12 mins with a nipple shield.  Instilled 5 fr feeding tube and 12 ml of donor breast milk into shield after baby latched.  Assisted Mom to use alternate breast compression to increase milk transfer during sucking.  Baby able to draw the DBM from syringe for 5 ml, but LC gently advanced it during sucking bursts.  After a burp, baby relatched onto right breast in cross cradle laid back hold.  Baby latched without a nipple shield and sucked with deep jaw extensions and swallows for another 6 mins before sliding off acting contented.  Assisted Mom to double pump right after breastfeeding.  Mom to use gentle massage during pumping.  Objective Infant data: Mother's Current Feeding Choice: Breast Milk and Donor Milk  Infant feeding assessment Scale for Readiness: 2 Scale for Quality: 3     Maternal data: [id number]  C-Section, Low Transverse Pumping frequency: Started pumping this am Pumped volume: 10 mL Flange Size: 21 Pump: Personal, Stork Pump (Mom Cozy and Facilities manager)  Assessment Infant: LATCH Score: 8  Feeding Status: Ad lib   Maternal: Milk volume: Normal   Intervention/Plan Interventions: Breast feeding basics reviewed; Skin to skin; Breast massage; Hand express; DEBP; Pace feeding  Tools: Pump; Flanges; Bottle Pump  Education: Setup, frequency, and cleaning; Milk Storage Nipple shield size: 20  Plan: Consult Status: NICU follow-up  NICU Follow-up type: Verify onset of copious milk    Broadus John 08/08/2022, 5:31 PM

## 2022-08-08 NOTE — Lactation Note (Signed)
This note was copied from a baby's chart.  NICU Lactation Consultation Note  Patient Name: Boy Callaghan Ara [id number] Date: 08/08/2022 Age:31 days   Subjective Reason for consult: Follow-up assessment; 1st time breastfeeding; Early term 37-38.6wks; Maternal endocrine disorder; Nipple pain/trauma  LC assisted with treatment of engorgement.  Both breast full and slightly engorged.  Mom hasn't been seeing increase milk volume with pumping.  Ice packs provided for 20 mins with Mom laying flat in recliner.  Mom pumped after and expressed about 84m.  Mom feels her breasts are softer.  Baby started showing feeding cues.  LC assisted with positioning and latching to the breast in football hold on the left breast.  Baby latched with a wide open gape of his mouth.  Lips flanged and baby sucking with deep jaw extensions and swallowing identified.  After 5 mins of consistent NS, baby paused and had a little increase respiratory rate. Baby fed for a total of 8 mins before coming off the breast on his own. When baby came off the breast, nipple was rounded and not pinched at all.  Mom does experience some pain during feeding, but feels it is getting better.  No visible trauma noted.  Mom burped baby and then after he started cueing, baby placed in laid back cradle hold.  Baby latched deeply and just sat there contented and breathing comfortably.   Mom to stay and feed CCeline Ahrwhen he shows any feeding cues.  Recommended after another hour or STS and breastfeeding ad lib, to ice and pump again. Objective Infant data: Mother's Current Feeding Choice: Breast Milk and Donor Milk  Infant feeding assessment Scale for Readiness: 2 Scale for Quality: 3     Maternal data: [id number] C-Section, Low Transverse Pumping frequency: 8 times in 24 hours Pumped volume: 3 mL Flange Size: 24   Pump: Personal, Stork Pump (Mom Cozy and SFacilities manager  Assessment Infant: LATCH Score: 8  Feeding Status: Ad  lib   Maternal: Milk volume: Low   Intervention/Plan Interventions: Breast feeding basics reviewed; Assisted with latch; Skin to skin; Breast massage; Hand express; Breast compression; Adjust position; Support pillows; Position options; Expressed milk; DEBP; Coconut oil  Tools: Pump; Flanges; Comfort gels (personal nipple balm) Pump Education: Setup, frequency, and cleaning; Milk Storage  Plan: Consult Status: NICU follow-up  NICU Follow-up type: Verify absence of engorgement; Verify onset of copious milk    SBroadus John2/24/2024, 11:44 AM

## 2022-08-13 ENCOUNTER — Telehealth (HOSPITAL_COMMUNITY): Payer: Self-pay | Admitting: *Deleted

## 2022-08-13 NOTE — Telephone Encounter (Signed)
Attempted Hospital Discharge Follow-Up Call.  Left voice mail requesting that patient return RN's phone call if patient has any concerns or questions.

## 2022-08-19 NOTE — Progress Notes (Deleted)
Cardio-Obstetrics Clinic  New Evaluation  Date:  08/20/2022   ID:  Ashley LucksChelsea P Mulrooney, DOB 10/04/1990, MRN [id number]  PCP:  Maurice SmallGriffin, Elaine, MD   [facility] Providers Cardiologist:  Maisie FusBranch, Mary E, MD  Electrophysiologist:  None      Referring MD: Maurice SmallGriffin, Elaine, MD   Chief Complaint: Palpitations  History of Present Illness:    Ashley LucksChelsea P Scherger is a 32 y.o. female [[id number]][[id number]] who is being seen today for the evaluation of palpitations at the request of Maurice SmallGriffin, Elaine, MD.   Patient seen by Dr. Wyline MoodBranch where she was referred for palpitations. Zio monitor was ordered and is pending.   Today, ***   Prior CV Studies Reviewed: The following studies were reviewed today: Zio pending  Past Medical History:  Diagnosis Date   ADHD (attention deficit hyperactivity disorder)    Diabetes mellitus without complication (HCC)    Dyspnea    PONV (postoperative nausea and vomiting)    Hard to wake up   UTI (lower urinary tract infection)     Past Surgical History:  Procedure Laterality Date   APPENDECTOMY  [id number]   CESAREAN SECTION     CESAREAN SECTION N/A 08/04/2022   Procedure: REPEAT CESAREAN SECTION EDC: 08-25-22 ALLERG: NKDA  PREVIOUS X 1;  Surgeon: Mitchel HonourMorris, Megan, DO;  Location: [facility] ORS;  Service: Obstetrics;  Laterality: N/A;   LAPAROSCOPIC APPENDECTOMY  10/13/2011   Procedure: APPENDECTOMY LAPAROSCOPIC;  Surgeon: Emelia LoronMatthew Wakefield, MD;  Location: [facility] ORS;  Service: General;  Laterality: N/A;   LAPAROSCOPY  10/13/2011   Procedure: LAPAROSCOPY DIAGNOSTIC;  Surgeon: Emelia LoronMatthew Wakefield, MD;  Location: [facility] ORS;  Service: General;  Laterality: N/A;   wisdon teeth extraction     { Click here to update PMH, PSH, OB Hx then refresh note  :1}   OB History     Gravida  3   Para  2   Term  1   Preterm  1   AB  1   Living  2      SAB  1   IAB  0   Ectopic  0   Multiple  0   Live Births  1           { Click here to update OB Charting then refresh note   :1}    Current Medications: No outpatient medications have been marked as taking for the 08/21/22 encounter (Appointment) with Meriam SpraguePemberton, Laycie Schriner E, MD.     Allergies:   Patient has no known allergies.   Social History   Socioeconomic History   Marital status: Married    Spouse name: Not on file   Number of children: Not on file   Years of education: Not on file   Highest education level: Not on file  Occupational History   Not on file  Tobacco Use   Smoking status: Never   Smokeless tobacco: Never  Vaping Use   Vaping Use: Never used  Substance and Sexual Activity   Alcohol use: No   Drug use: No   Sexual activity: Not Currently  Other Topics Concern   Not on file  Social History Narrative   Not on file   Social Determinants of Health   Financial Resource Strain: Not on file  Food Insecurity: No Food Insecurity (07/24/2022)   Hunger Vital Sign    Worried About Running Out of Food in the Last Year: Never true    Ran Out of Food in the Last Year: Never true  Transportation Needs: No Transportation Needs (07/24/2022)   PRAPARE - Administrator, Civil ServiceTransportation    Lack of Transportation (Medical): No    Lack of Transportation (Non-Medical): No  Physical Activity: Not on file  Stress: Not on file  Social Connections: Not on file  { Click here to update SDOH then refresh :1}    Family History  Problem Relation Age of Onset   Cancer Mother        Thyroid   Hypertension Mother    Cancer Paternal Uncle        brain/lung   Diabetes Paternal Uncle    Crohn's disease Maternal Grandmother    Cancer Maternal Grandmother    { Click here to update FH then refresh note    :1}   ROS:   Please see the history of present illness.    *** All other systems reviewed and are negative.   Labs/EKG Reviewed:    EKG:   EKG is *** ordered today.  The ekg ordered today demonstrates ***  Recent Labs: 08/05/2022: Hemoglobin 9.6; Platelets 184 08/07/2022: ALT 16; BUN 6; Creatinine, Ser 0.82;  Potassium 3.8; Sodium 138   Recent Lipid Panel Lab Results  Component Value Date/Time   TRIG 724 (H) 12/08/2016 04:10 PM    Physical Exam:    VS:  LMP 11/18/2021     Wt Readings from Last 3 Encounters:  07/24/22 194 lb 12.8 oz (88.4 kg)  07/21/22 195 lb (88.5 kg)  07/21/22 194 lb 9.6 oz (88.3 kg)     GEN: *** Well nourished, well developed in no acute distress HEENT: Normal NECK: No JVD; No carotid bruits LYMPHATICS: No lymphadenopathy CARDIAC: ***RRR, no murmurs, rubs, gallops RESPIRATORY:  Clear to auscultation without rales, wheezing or rhonchi  ABDOMEN: Soft, non-tender, non-distended MUSCULOSKELETAL:  No edema; No deformity  SKIN: Warm and dry NEUROLOGIC:  Alert and oriented x 3 PSYCHIATRIC:  Normal affect    Risk Assessment/Risk Calculators:   { Click to calculate CARPREG II - THEN refresh note :1}    { Click to caclulate Mod WHO Class of CV Risk - THEN refresh note :1}     { Click for CHADS2VASc Score - THEN Refresh Note    :[id number]}      ASSESSMENT & PLAN:    #Palpitations: Common in pregnancy. Currently zio monitor is pending ***  #Type I DM:  There are no Patient Instructions on file for this visit.   Dispo:  No follow-ups on file.   Medication Adjustments/Labs and Tests Ordered: Current medicines are reviewed at length with the patient today.  Concerns regarding medicines are outlined above.  Tests Ordered: No orders of the defined types were placed in this encounter.  Medication Changes: No orders of the defined types were placed in this encounter.

## 2022-08-21 ENCOUNTER — Ambulatory Visit: Payer: 59 | Admitting: Cardiology

## 2022-09-30 ENCOUNTER — Other Ambulatory Visit: Payer: Self-pay | Admitting: Obstetrics & Gynecology

## 2022-09-30 DIAGNOSIS — N6310 Unspecified lump in the right breast, unspecified quadrant: Secondary | ICD-10-CM

## 2022-10-20 ENCOUNTER — Other Ambulatory Visit: Payer: Self-pay | Admitting: Obstetrics & Gynecology

## 2022-10-20 ENCOUNTER — Ambulatory Visit
Admission: RE | Admit: 2022-10-20 | Discharge: 2022-10-20 | Disposition: A | Discharge status: Home / Self Care | Payer: 59 | Source: Ambulatory Visit | Attending: Obstetrics & Gynecology | Admitting: Obstetrics & Gynecology

## 2022-10-20 DIAGNOSIS — N6310 Unspecified lump in the right breast, unspecified quadrant: Secondary | ICD-10-CM

## 2023-07-27 ENCOUNTER — Ambulatory Visit
Admission: RE | Admit: 2023-07-27 | Discharge: 2023-07-27 | Disposition: A | Discharge status: Home / Self Care | Payer: 59 | Source: Ambulatory Visit | Attending: Obstetrics & Gynecology | Admitting: Obstetrics & Gynecology

## 2023-07-27 DIAGNOSIS — N6310 Unspecified lump in the right breast, unspecified quadrant: Secondary | ICD-10-CM

## 2024-05-05 ENCOUNTER — Encounter (HOSPITAL_BASED_OUTPATIENT_CLINIC_OR_DEPARTMENT_OTHER): Payer: Self-pay

## 2024-05-05 ENCOUNTER — Observation Stay (HOSPITAL_BASED_OUTPATIENT_CLINIC_OR_DEPARTMENT_OTHER)
Admission: EM | Admit: 2024-05-05 | Discharge: 2024-05-07 | Disposition: A | Discharge status: Home / Self Care | Attending: Emergency Medicine | Admitting: Emergency Medicine

## 2024-05-05 ENCOUNTER — Other Ambulatory Visit: Payer: Self-pay

## 2024-05-05 DIAGNOSIS — R000 Tachycardia, unspecified: Secondary | ICD-10-CM | POA: Diagnosis not present

## 2024-05-05 DIAGNOSIS — O24919 Unspecified diabetes mellitus in pregnancy, unspecified trimester: Secondary | ICD-10-CM | POA: Diagnosis present

## 2024-05-05 DIAGNOSIS — J45909 Unspecified asthma, uncomplicated: Secondary | ICD-10-CM | POA: Diagnosis present

## 2024-05-05 DIAGNOSIS — F909 Attention-deficit hyperactivity disorder, unspecified type: Secondary | ICD-10-CM | POA: Diagnosis not present

## 2024-05-05 DIAGNOSIS — Z794 Long term (current) use of insulin: Secondary | ICD-10-CM | POA: Diagnosis not present

## 2024-05-05 DIAGNOSIS — E109 Type 1 diabetes mellitus without complications: Secondary | ICD-10-CM | POA: Diagnosis not present

## 2024-05-05 DIAGNOSIS — R112 Nausea with vomiting, unspecified: Secondary | ICD-10-CM | POA: Diagnosis present

## 2024-05-05 DIAGNOSIS — A084 Viral intestinal infection, unspecified: Secondary | ICD-10-CM | POA: Diagnosis not present

## 2024-05-05 LAB — URINALYSIS, ROUTINE W REFLEX MICROSCOPIC
Bacteria, UA: NONE SEEN
Bilirubin Urine: NEGATIVE
Glucose, UA: 500 mg/dL — AB
Hgb urine dipstick: NEGATIVE
Ketones, ur: >80 mg/dL — AB
Leukocytes,Ua: NEGATIVE
Nitrite: NEGATIVE
Specific Gravity, Urine: 1.027 (ref 1.005–1.030)
pH: 5.5 (ref 5.0–8.0)

## 2024-05-05 LAB — COMPREHENSIVE METABOLIC PANEL WITH GFR
ALT: 15 U/L (ref 0–44)
AST: 17 U/L (ref 15–41)
Albumin: 4.7 g/dL (ref 3.5–5.0)
Alkaline Phosphatase: 75 U/L (ref 38–126)
Anion gap: 16 — ABNORMAL HIGH (ref 5–15)
BUN: 18 mg/dL (ref 6–20)
CO2: 22 mmol/L (ref 22–32)
Calcium: 9.8 mg/dL (ref 8.9–10.3)
Chloride: 100 mmol/L (ref 98–111)
Creatinine, Ser: 0.99 mg/dL (ref 0.44–1.00)
GFR, Estimated: >60 mL/min (ref >60)
Glucose, Bld: 208 mg/dL — ABNORMAL HIGH (ref 70–99)
Potassium: 4.5 mmol/L (ref 3.5–5.1)
Sodium: 138 mmol/L (ref 135–145)
Total Bilirubin: 1.5 mg/dL — ABNORMAL HIGH (ref 0.0–1.2)
Total Protein: 7.8 g/dL (ref 6.5–8.1)

## 2024-05-05 LAB — CBC
HCT: 44.4 % (ref 36.0–46.0)
Hemoglobin: 15.4 g/dL — ABNORMAL HIGH (ref 12.0–15.0)
MCH: 30 pg (ref 26.0–34.0)
MCHC: 34.7 g/dL (ref 30.0–36.0)
MCV: 86.5 fL (ref 80.0–100.0)
Platelets: 286 K/uL (ref 150–400)
RBC: 5.13 MIL/uL — ABNORMAL HIGH (ref 3.87–5.11)
RDW: 11.8 % (ref 11.5–15.5)
WBC: 15.5 K/uL — ABNORMAL HIGH (ref 4.0–10.5)
nRBC: 0 % (ref 0.0–0.2)

## 2024-05-05 LAB — CBG MONITORING, ED: Glucose-Capillary: 217 mg/dL — ABNORMAL HIGH (ref 70–99)

## 2024-05-05 LAB — PREGNANCY, URINE: Preg Test, Ur: NEGATIVE

## 2024-05-05 LAB — LIPASE, BLOOD: Lipase: 16 U/L (ref 11–51)

## 2024-05-05 MED ORDER — ONDANSETRON HCL 4 MG/2ML IJ SOLN
4.0000 mg | Freq: Once | INTRAMUSCULAR | Status: AC
  Administered 2024-05-05: 4 mg via INTRAVENOUS
  Filled 2024-05-05: qty 2

## 2024-05-05 MED ORDER — LACTATED RINGERS IV BOLUS
1000.0000 mL | Freq: Once | INTRAVENOUS | Status: AC
  Administered 2024-05-05: 1000 mL via INTRAVENOUS

## 2024-05-05 MED ORDER — ONDANSETRON HCL 4 MG PO TABS: 4.0000 mg | ORAL_TABLET | Freq: Three times a day (TID) | ORAL | 0 refills | Status: AC | PRN

## 2024-05-05 NOTE — ED Provider Notes (Signed)
 [place] EMERGENCY DEPARTMENT AT [facility] Provider Note   CSN: [id number] Arrival date & time: 05/05/24  1836     Patient presents with: Emesis and Diarrhea   Ashley Hester is a 33 y.o. female.  With a history of type 1 diabetes who presents the ED for GI illness.  Patient recently recovered from a viral upper respiratory symptoms.  This afternoon started with nausea vomiting diarrhea.  No dry heaving at home unable to keep anything down.  Hyperglycemia on Dexcom in 200s.  No fevers chills respiratory symptoms no  {Add pertinent medical, surgical, social history, OB history to HPI:32947}  Emesis Associated symptoms: diarrhea   Diarrhea Associated symptoms: vomiting        Prior to Admission medications   Medication Sig Start Date End Date Taking? Authorizing Provider  acetaminophen  (TYLENOL ) 500 MG tablet Take 2 tablets (1,000 mg total) by mouth every 6 (six) hours. 08/07/22   Laurence Slater PARAS, MD  ferrous sulfate  325 (65 FE) MG EC tablet Take 1 tablet every day by oral route. 12/23/16   [provider]  HUMALOG 100 UNIT/ML injection SMARTSIG:0-50 Unit(s) SUB-Q    [provider]  ibuprofen  (ADVIL ) 600 MG tablet Take 1 tablet (600 mg total) by mouth every 6 (six) hours. 08/07/22   Laurence Slater PARAS, MD  Insulin  Human (INSULIN  PUMP) SOLN Inject into the skin.    [provider]  oxyCODONE  (OXY IR/ROXICODONE ) 5 MG immediate release tablet Take 1 tablet (5 mg total) by mouth every 4 (four) hours as needed for moderate pain. 08/07/22   Laurence Slater PARAS, MD  Prenatal Vit-Fe Fumarate-FA (PRENATAL MULTIVITAMIN) TABS tablet Take 1 tablet by mouth daily at 12 noon.    [provider]  senna-docusate (SENOKOT-S) 8.6-50 MG tablet Take 2 tablets by mouth daily. 08/07/22   Laurence Slater PARAS, MD    Allergies: Patient has no known allergies.    Review of Systems  Gastrointestinal:  Positive for diarrhea and vomiting.    Updated Vital Signs BP 108/84 (BP  Location: Right Arm)   Pulse (!) 147   Temp 98.3 F (36.8 C) (Oral)   Resp 18   Ht 5' 4 (1.626 m)   Wt 56.7 kg   SpO2 98%   BMI 21.46 kg/m   Physical Exam Vitals and nursing note reviewed.  HENT:     Head: Normocephalic and atraumatic.     Mouth/Throat:     Mouth: Mucous membranes are dry.  Eyes:     Pupils: Pupils are equal, round, and reactive to light.  Cardiovascular:     Rate and Rhythm: Normal rate and regular rhythm.  Pulmonary:     Effort: Pulmonary effort is normal.     Breath sounds: Normal breath sounds.  Abdominal:     Palpations: Abdomen is soft.     Tenderness: There is no abdominal tenderness.  Skin:    General: Skin is warm and dry.  Neurological:     Mental Status: She is alert.  Psychiatric:        Mood and Affect: Mood normal.     (all labs ordered are listed, but only abnormal results are displayed) Labs Reviewed  CBC - Abnormal; Notable for the following components:      Result Value   WBC 15.5 (*)    RBC 5.13 (*)    Hemoglobin 15.4 (*)    All other components within normal limits  LIPASE, BLOOD  COMPREHENSIVE METABOLIC PANEL WITH GFR  URINALYSIS, ROUTINE W REFLEX MICROSCOPIC  PREGNANCY, URINE  CBG MONITORING, ED    EKG: None  Radiology: No results found.  {Document cardiac monitor, telemetry assessment procedure when appropriate:32947} Procedures   Medications Ordered in the ED  ondansetron  (ZOFRAN ) injection 4 mg (4 mg Intravenous Given 05/05/24 1933)  lactated ringers  bolus 1,000 mL (1,000 mLs Intravenous New Bag/Given 05/05/24 1934)      {Click here for ABCD2, HEART and other calculators REFRESH Note before signing:1}                              Medical Decision Making 33 year old female with history as above presented to the ED for nausea vomiting diarrhea starting today.  Hyperglycemia in the setting of type 1 diabetes with GI losses.  Dry on clinical exam.  Low suspicion for DKA but do suspect significant dehydration.   Will provide IV fluids for hydration and Zofran  for nausea vomiting and obtain laboratory workup to look for severe electrolyte derangement EKG to look for any evidence of dysrhythmia in the setting of electrolyte derangement.  Will reassess after 2 L LR  Amount and/or Complexity of Data Reviewed Labs: ordered.  Risk Prescription drug management.     {Document critical care time when appropriate  Document review of labs and clinical decision tools ie CHADS2VASC2, etc  Document your independent review of radiology images and any outside records  Document your discussion with family members, caretakers and with consultants  Document social determinants of health affecting pt's care  Document your decision making why or why not admission, treatments were needed:32947:::1}   Final diagnoses:  None    ED Discharge Orders     None

## 2024-05-05 NOTE — Discharge Instructions (Signed)
 You were seen in the emerged department for nausea vomiting and diarrhea You felt better after 3 L of IV fluids here and your heart rate came down We have called in a prescription for Zofran  for you to pick up in your pharmacy and begin taking as directed for nausea or vomiting at home Keep well-hydrated Return to the emergency department for severe dehydration if you are unable to eat or drink at home or for any other concerns Otherwise follow-up with your primary doctor in 1 week for reevaluation

## 2024-05-05 NOTE — ED Notes (Signed)
 Brought pt sprite zero

## 2024-05-05 NOTE — ED Triage Notes (Signed)
 Pt reports N/V/D starting this afternoon. Pt tachycardic in triage. Pt also type 1 Diabetic.

## 2024-05-06 ENCOUNTER — Encounter (HOSPITAL_COMMUNITY): Payer: Self-pay | Admitting: Internal Medicine

## 2024-05-06 ENCOUNTER — Observation Stay (HOSPITAL_COMMUNITY)

## 2024-05-06 DIAGNOSIS — J45909 Unspecified asthma, uncomplicated: Secondary | ICD-10-CM | POA: Diagnosis present

## 2024-05-06 DIAGNOSIS — E109 Type 1 diabetes mellitus without complications: Secondary | ICD-10-CM

## 2024-05-06 DIAGNOSIS — Z743 Need for continuous supervision: Secondary | ICD-10-CM | POA: Diagnosis not present

## 2024-05-06 DIAGNOSIS — R197 Diarrhea, unspecified: Secondary | ICD-10-CM | POA: Diagnosis not present

## 2024-05-06 DIAGNOSIS — R1084 Generalized abdominal pain: Secondary | ICD-10-CM | POA: Diagnosis not present

## 2024-05-06 DIAGNOSIS — I959 Hypotension, unspecified: Secondary | ICD-10-CM | POA: Diagnosis not present

## 2024-05-06 DIAGNOSIS — A084 Viral intestinal infection, unspecified: Secondary | ICD-10-CM | POA: Diagnosis present

## 2024-05-06 DIAGNOSIS — R000 Tachycardia, unspecified: Secondary | ICD-10-CM | POA: Diagnosis present

## 2024-05-06 DIAGNOSIS — Z8759 Personal history of other complications of pregnancy, childbirth and the puerperium: Secondary | ICD-10-CM | POA: Insufficient documentation

## 2024-05-06 LAB — COMPREHENSIVE METABOLIC PANEL WITH GFR
ALT: 14 U/L (ref 0–44)
AST: 14 U/L — ABNORMAL LOW (ref 15–41)
Albumin: 2.9 g/dL — ABNORMAL LOW (ref 3.5–5.0)
Alkaline Phosphatase: 48 U/L (ref 38–126)
Anion gap: 8 (ref 5–15)
BUN: 8 mg/dL (ref 6–20)
CO2: 23 mmol/L (ref 22–32)
Calcium: 8.2 mg/dL — ABNORMAL LOW (ref 8.9–10.3)
Chloride: 103 mmol/L (ref 98–111)
Creatinine, Ser: 0.98 mg/dL (ref 0.44–1.00)
GFR, Estimated: >60 mL/min (ref >60)
Glucose, Bld: 122 mg/dL — ABNORMAL HIGH (ref 70–99)
Potassium: 3.7 mmol/L (ref 3.5–5.1)
Sodium: 134 mmol/L — ABNORMAL LOW (ref 135–145)
Total Bilirubin: 1.9 mg/dL — ABNORMAL HIGH (ref 0.0–1.2)
Total Protein: 5.5 g/dL — ABNORMAL LOW (ref 6.5–8.1)

## 2024-05-06 LAB — CK: Total CK: 37 U/L — ABNORMAL LOW (ref 38–234)

## 2024-05-06 LAB — RESP PANEL BY RT-PCR (RSV, FLU A&B, COVID)  RVPGX2
Influenza A by PCR: NEGATIVE
Influenza B by PCR: NEGATIVE
Resp Syncytial Virus by PCR: NEGATIVE
SARS Coronavirus 2 by RT PCR: NEGATIVE

## 2024-05-06 LAB — BASIC METABOLIC PANEL WITH GFR
Anion gap: 16 — ABNORMAL HIGH (ref 5–15)
BUN: 14 mg/dL (ref 6–20)
CO2: 18 mmol/L — ABNORMAL LOW (ref 22–32)
Calcium: 8.9 mg/dL (ref 8.9–10.3)
Chloride: 100 mmol/L (ref 98–111)
Creatinine, Ser: 0.78 mg/dL (ref 0.44–1.00)
GFR, Estimated: >60 mL/min (ref >60)
Glucose, Bld: 185 mg/dL — ABNORMAL HIGH (ref 70–99)
Potassium: 3.9 mmol/L (ref 3.5–5.1)
Sodium: 134 mmol/L — ABNORMAL LOW (ref 135–145)

## 2024-05-06 LAB — MAGNESIUM: Magnesium: 1.6 mg/dL — ABNORMAL LOW (ref 1.7–2.4)

## 2024-05-06 LAB — CBC
HCT: 33.5 % — ABNORMAL LOW (ref 36.0–46.0)
Hemoglobin: 11.6 g/dL — ABNORMAL LOW (ref 12.0–15.0)
MCH: 30.2 pg (ref 26.0–34.0)
MCHC: 34.6 g/dL (ref 30.0–36.0)
MCV: 87.2 fL (ref 80.0–100.0)
Platelets: 212 K/uL (ref 150–400)
RBC: 3.84 MIL/uL — ABNORMAL LOW (ref 3.87–5.11)
RDW: 11.8 % (ref 11.5–15.5)
WBC: 6.6 K/uL (ref 4.0–10.5)
nRBC: 0 % (ref 0.0–0.2)

## 2024-05-06 LAB — URINE DRUG SCREEN
Amphetamines: POSITIVE — AB
Barbiturates: NEGATIVE
Benzodiazepines: NEGATIVE
Cocaine: NEGATIVE
Fentanyl: NEGATIVE
Methadone Scn, Ur: NEGATIVE
Opiates: NEGATIVE
Tetrahydrocannabinol: NEGATIVE

## 2024-05-06 LAB — CBG MONITORING, ED
Glucose-Capillary: 184 mg/dL — ABNORMAL HIGH (ref 70–99)
Glucose-Capillary: 163 mg/dL — ABNORMAL HIGH (ref 70–99)
Glucose-Capillary: 134 mg/dL — ABNORMAL HIGH (ref 70–99)

## 2024-05-06 LAB — GLUCOSE, CAPILLARY: Glucose-Capillary: 129 mg/dL — ABNORMAL HIGH (ref 70–99)

## 2024-05-06 LAB — TSH: TSH: 0.58 u[IU]/mL (ref 0.350–4.500)

## 2024-05-06 LAB — ETHANOL: Alcohol, Ethyl (B): <15 mg/dL (ref <15)

## 2024-05-06 LAB — D-DIMER, QUANTITATIVE: D-Dimer, Quant: <0.27 ug{FEU}/mL (ref 0.00–0.50)

## 2024-05-06 LAB — HIV ANTIBODY (ROUTINE TESTING W REFLEX): HIV Screen 4th Generation wRfx: NONREACTIVE

## 2024-05-06 MED ORDER — POLYETHYLENE GLYCOL 3350 17 G PO PACK: 17.0000 g | PACK | Freq: Every day | ORAL | Status: DC | PRN

## 2024-05-06 MED ORDER — INSULIN ASPART 100 UNIT/ML IJ SOLN
0.0000 [IU] | Freq: Three times a day (TID) | INTRAMUSCULAR | Status: DC
  Administered 2024-05-06: 1 [IU] via SUBCUTANEOUS
  Filled 2024-05-06: qty 1

## 2024-05-06 MED ORDER — ENOXAPARIN SODIUM 40 MG/0.4ML IJ SOSY
40.0000 mg | PREFILLED_SYRINGE | INTRAMUSCULAR | Status: DC
  Administered 2024-05-06: 40 mg via SUBCUTANEOUS
  Filled 2024-05-06: qty 0.4

## 2024-05-06 MED ORDER — SODIUM CHLORIDE 0.9% FLUSH
3.0000 mL | Freq: Two times a day (BID) | INTRAVENOUS | Status: DC
  Administered 2024-05-06 – 2024-05-07 (×3): 3 mL via INTRAVENOUS

## 2024-05-06 MED ORDER — ACETAMINOPHEN 325 MG PO TABS: 650.0000 mg | ORAL_TABLET | Freq: Four times a day (QID) | ORAL | Status: DC | PRN

## 2024-05-06 MED ORDER — ONDANSETRON HCL 4 MG/2ML IJ SOLN
4.0000 mg | Freq: Four times a day (QID) | INTRAMUSCULAR | Status: DC | PRN
Start: 2024-05-06 — End: 2024-05-07

## 2024-05-06 MED ORDER — INSULIN ASPART 100 UNIT/ML IJ SOLN
0.0000 [IU] | Freq: Three times a day (TID) | INTRAMUSCULAR | Status: DC
  Administered 2024-05-07: 3 [IU] via SUBCUTANEOUS
  Filled 2024-05-06: qty 3

## 2024-05-06 MED ORDER — LACTATED RINGERS IV SOLN: INTRAVENOUS | Status: AC

## 2024-05-06 MED ORDER — ACETAMINOPHEN 500 MG PO TABS
1000.0000 mg | ORAL_TABLET | Freq: Once | ORAL | Status: AC
  Administered 2024-05-06: 1000 mg via ORAL
  Filled 2024-05-06: qty 2

## 2024-05-06 MED ORDER — MAGNESIUM SULFATE 2 GM/50ML IV SOLN
2.0000 g | Freq: Once | INTRAVENOUS | Status: AC
  Administered 2024-05-06: 2 g via INTRAVENOUS
  Filled 2024-05-06: qty 50

## 2024-05-06 MED ORDER — INSULIN ASPART 100 UNIT/ML IJ SOLN
2.0000 [IU] | Freq: Once | INTRAMUSCULAR | Status: AC
  Administered 2024-05-06: 2 [IU] via SUBCUTANEOUS
  Filled 2024-05-06: qty 2

## 2024-05-06 MED ORDER — ACETAMINOPHEN 650 MG RE SUPP: 650.0000 mg | Freq: Four times a day (QID) | RECTAL | Status: DC | PRN

## 2024-05-06 MED ORDER — LORAZEPAM 2 MG/ML IJ SOLN
0.5000 mg | Freq: Once | INTRAMUSCULAR | Status: AC
  Administered 2024-05-06: 0.5 mg via INTRAVENOUS
  Filled 2024-05-06: qty 1

## 2024-05-06 MED ORDER — INSULIN ASPART 100 UNIT/ML IJ SOLN: 0.0000 [IU] | Freq: Every day | INTRAMUSCULAR | Status: DC

## 2024-05-06 NOTE — H&P (Signed)
 History and Physical   Ashley Hester FMW:[id number] DOB: 1990-07-15 DOA: 05/05/2024  PCP: Clinic, [facility]   Patient coming from: Home  Chief Complaint: Nausea, vomiting, diarrhea  HPI: Ashley Hester is a 33 y.o. female with medical history significant of type 1 diabetes, asthma, ADHD, preeclampsia presenting with nausea vomiting and diarrhea.  Reports she was recently treated for URI which is improved.  Had 1 day of nausea vomiting diarrhea with inability to tolerate p.o.  Noted blood sugar reading in the 200s.  Came to the ED for further evaluation.  Reports chest heaviness, Denies fevers, chills, shortness of breath. Has not had Adderall in several weeks.  ED Course: Vital signs in the ED notable for blood pressure in the 80s-100s systolic, baseline appears to be in the 90s-110 systolic.  Heart rate in the 90s-140s.  Respiratory rate in the teens-20s.  Lab workup included CMP with bicarb 22, gap 16, glucose 208.  Repeat BMP early this morning showed bicarb worsened to 18 with gap of 16.  CBC with leukocytosis to 15.5, hemoglobin 15.4 which is above previous baseline of 10.  Lipase normal.  Respiratory panel for flu COVID and RSV negative.  Urinalysis with glucose, ketones, protein.  UDS with amphetamines consistent with Adderall prescription.  Ethanol level negative.  TSH normal.  CK normal.  D-dimer normal.  Patient received 3 L IV fluid in the setting of nausea vomiting diarrhea but had persistent tachycardia and lightheadedness on standing.  Admitted to the ED for further workup.  Review of Systems: As per HPI otherwise all other systems reviewed and are negative.  Past Medical History:  Diagnosis Date   ADHD (attention deficit hyperactivity disorder)    Diabetes mellitus without complication (HCC)    Dyspnea    PONV (postoperative nausea and vomiting)    Hard to wake up   UTI (lower urinary tract infection)     Past Surgical History:  Procedure Laterality  Date   APPENDECTOMY  [id number]   CESAREAN SECTION     CESAREAN SECTION N/A 08/04/2022   Procedure: REPEAT CESAREAN SECTION EDC: 08-25-22 ALLERG: NKDA  PREVIOUS X 1;  Surgeon: Dannielle Bouchard, DO;  Location: [facility] LD ORS;  Service: Obstetrics;  Laterality: N/A;   LAPAROSCOPIC APPENDECTOMY  10/13/2011   Procedure: APPENDECTOMY LAPAROSCOPIC;  Surgeon: Donnice Bury, MD;  Location: [facility] ORS;  Service: General;  Laterality: N/A;   LAPAROSCOPY  10/13/2011   Procedure: LAPAROSCOPY DIAGNOSTIC;  Surgeon: Donnice Bury, MD;  Location: [facility] ORS;  Service: General;  Laterality: N/A;   wisdon teeth extraction      Social History  reports that she has never smoked. She has never used smokeless tobacco. She reports that she does not drink alcohol and does not use drugs.  No Known Allergies  Family History  Problem Relation Age of Onset   Cancer Mother        Thyroid    Hypertension Mother    Cancer Paternal Uncle        brain/lung   Diabetes Paternal Uncle    Crohn's disease Maternal Grandmother    Cancer Maternal Grandmother    Breast cancer Neg Hx   Reviewed on admission  Prior to Admission medications   Medication Sig Start Date End Date Taking? Authorizing Provider  ondansetron  (ZOFRAN ) 4 MG tablet Take 1 tablet (4 mg total) by mouth every 8 (eight) hours as needed for up to 4 days for nausea or vomiting. 05/05/24 05/09/24 Yes Pamella Ozell LABOR, DO  acetaminophen  (  TYLENOL ) 500 MG tablet Take 2 tablets (1,000 mg total) by mouth every 6 (six) hours. 08/07/22   Laurence Slater PARAS, MD  amphetamine-dextroamphetamine (ADDERALL XR) 20 MG 24 hr capsule Take 20 mg by mouth every morning.    [provider]  amphetamine-dextroamphetamine (ADDERALL) 5 MG tablet Take 1 tablet by mouth 2 (two) times daily. 05/04/24   [provider]  azithromycin (ZITHROMAX) 250 MG tablet Take 250 mg by mouth as directed. 05/02/24   [provider]  ferrous sulfate  325 (65 FE) MG EC tablet Take 1 tablet  every day by oral route. 12/23/16   [provider]  HUMALOG 100 UNIT/ML injection SMARTSIG:0-50 Unit(s) SUB-Q    [provider]  ibuprofen  (ADVIL ) 600 MG tablet Take 1 tablet (600 mg total) by mouth every 6 (six) hours. 08/07/22   Laurence Slater PARAS, MD  Insulin  Human (INSULIN  PUMP) SOLN Inject into the skin.    [provider]  oxyCODONE  (OXY IR/ROXICODONE ) 5 MG immediate release tablet Take 1 tablet (5 mg total) by mouth every 4 (four) hours as needed for moderate pain. 08/07/22   Laurence Slater PARAS, MD  Prenatal Vit-Fe Fumarate-FA (PRENATAL MULTIVITAMIN) TABS tablet Take 1 tablet by mouth daily at 12 noon.    [provider]  senna-docusate (SENOKOT-S) 8.6-50 MG tablet Take 2 tablets by mouth daily. 08/07/22   Laurence Slater PARAS, MD    Physical Exam: Vitals:   05/06/24 1130 05/06/24 1200 05/06/24 1300 05/06/24 1331  BP: 104/72 98/65  106/75  Pulse: 95 90 (!) 108 (!) 113  Resp: (!) 26 (!) 25  17  Temp:  98 F (36.7 C) 98.4 F (36.9 C) 98.4 F (36.9 C)  TempSrc:  Oral Oral Oral  SpO2: 94% 98%    Weight:    61.6 kg  Height:    5' 4 (1.626 m)    Physical Exam Constitutional:      General: She is not in acute distress.    Appearance: Normal appearance.  HENT:     Head: Normocephalic and atraumatic.     Mouth/Throat:     Mouth: Mucous membranes are moist.     Pharynx: Oropharynx is clear.  Eyes:     Extraocular Movements: Extraocular movements intact.     Pupils: Pupils are equal, round, and reactive to light.  Cardiovascular:     Rate and Rhythm: Regular rhythm. Tachycardia present.     Pulses: Normal pulses.     Heart sounds: Normal heart sounds.  Pulmonary:     Effort: Pulmonary effort is normal. No respiratory distress.     Breath sounds: Normal breath sounds.  Abdominal:     General: Bowel sounds are normal. There is no distension.     Palpations: Abdomen is soft.     Tenderness: There is no abdominal tenderness.  Musculoskeletal:        General: No  swelling or deformity.  Skin:    General: Skin is warm and dry.  Neurological:     General: No focal deficit present.     Mental Status: Mental status is at baseline.    Labs on Admission: I have personally reviewed following labs and imaging studies  CBC: Recent Labs  Lab 05/05/24 1938  WBC 15.5*  HGB 15.4*  HCT 44.4  MCV 86.5  PLT 286    Basic Metabolic Panel: Recent Labs  Lab 05/05/24 1938 05/06/24 0344  NA 138 134*  K 4.5 3.9  CL 100 100  CO2 22  18*  GLUCOSE 208* 185*  BUN 18 14  CREATININE 0.99 0.78  CALCIUM  9.8 8.9    GFR: Estimated Creatinine Clearance: 87.2 mL/min (by C-G formula based on SCr of 0.78 mg/dL).  Liver Function Tests: Recent Labs  Lab 05/05/24 1938  AST 17  ALT 15  ALKPHOS 75  BILITOT 1.5*  PROT 7.8  ALBUMIN  4.7    Urine analysis:    Component Value Date/Time   COLORURINE YELLOW 05/05/2024 1938   APPEARANCEUR CLEAR 05/05/2024 1938   LABSPEC 1.027 05/05/2024 1938   PHURINE 5.5 05/05/2024 1938   GLUCOSEU 500 (A) 05/05/2024 1938   HGBUR NEGATIVE 05/05/2024 1938   BILIRUBINUR NEGATIVE 05/05/2024 1938   KETONESUR >80 (A) 05/05/2024 1938   PROTEINUR TRACE (A) 05/05/2024 1938   UROBILINOGEN 0.2 07/14/2014 0050   NITRITE NEGATIVE 05/05/2024 1938   LEUKOCYTESUR NEGATIVE 05/05/2024 1938    Radiological Exams on Admission: No results found.  EKG: Independently reviewed.  Sinus tachycardia at 125 bpm.  Assessment/Plan Principal Problem:   Tachycardia, unspecified Active Problems:   Diabetes in pregnancy   Type 1 diabetes (HCC)   Asthma   Sinus tachycardia > Patient presenting with persistent sinus tachycardia worse with any activity in the ED. > Initially thought to be secondary to dehydration as she had a day of nausea vomiting and diarrhea with decreased p.o. intake. > Despite 3 L plus of IV fluids in the ED has continued to have episodes of tachycardia in the 110s-120s. > Chart review shows history of some prior  tachycardia issues earlier last year with a baseline heart rate in the 100's and heart rate increased into the 130s-140s with activity however this was while patient was pregnant.  Did see cardiology for this in February 2024 and had a Zio patch placed for 7 days but I do not see the results of this (reports she did not end up wearing it; it was thought to be 2/2 preganancy and adderrall at that time). > Given persistent tachycardia without clear underlying cause will evaluate further.  Will start with chest x-ray in the setting of leukocytosis and recent URI and to rule out any infectious etiology.  Urinalysis already shows no evidence of infection but did show evidence of glucose and ketone in the setting of elevated CBG. > With unexplained sinus tachycardia with some symptoms/lightheaded on standing.  Will also evaluate for POTS with orthostatic vital signs and check echocardiogram. - Continue to monitor on telemetry - Echocardiogram  - Orthostatic vital signs  - Chest x-ray  - Repeat EKG  - Continue with IV fluids - Trend labs, Check Mg - Supportive care, if no underlying source identified, could trial BB for Symptoms and refer to cardiology.  Type 1 diabetes - SSI  ADHD - Takes Adderall at home   DVT prophylaxis: Lovenox  Code Status:   Full Family Communication:  Updated at bedside  Disposition Plan:   Patient is from:  Home  Anticipated DC to:  Home  Anticipated DC date:  1 to 2 days  Anticipated DC barriers: None  Consults called:  None Admission status:  Observation, telemetry  Severity of Illness: The appropriate patient status for this patient is OBSERVATION. Observation status is judged to be reasonable and necessary in order to provide the required intensity of service to ensure the patient's safety. The patient's presenting symptoms, physical exam findings, and initial radiographic and laboratory data in the context of their medical condition is felt to place them at  decreased risk for  further clinical deterioration. Furthermore, it is anticipated that the patient will be medically stable for discharge from the hospital within 2 midnights of admission.    Marsa KATHEE Scurry MD [facility]  How to contact the [facility] Attending or Consulting provider 7A - 7P or covering provider during after hours 7P -7A, for this patient?   Check the care team in [facility] and look for a) attending/consulting [facility] provider listed and b) the [facility] team listed Log into [email or web address] and use [place]'s universal password to access. If you do not have the password, please contact the hospital operator. Locate the [facility] provider you are looking for under [facility] and page to a number that you can be directly reached. If you still have difficulty reaching the provider, please page the [facility] (Director on Call) for the Hospitalists listed on amion for assistance.  05/06/2024, 2:03 PM

## 2024-05-06 NOTE — ED Provider Notes (Signed)
 Patient signed out pending reassessment.  On multiple repeat assessments she remains tachycardic.  She has been appropriately volume resuscitated.  She has been given both Ativan  and Tylenol  to address any brewing fever or anxiety.  TSH is normal.  D-dimer is negative.  Have added CK.  Repeat metabolic panel now with a CO2 of 18 and a persistent anion gap of 16.  Glucose is 185.  Unclear of exact etiology.  Given persistent vital sign derangements will plan for admission.  Clinically speaking, patient states she feels much improved.  Physical Exam  BP (!) 93/48   Pulse 100   Temp 98.1 F (36.7 C) (Oral)   Resp (!) 21   Ht 1.626 m (5' 4)   Wt 56.7 kg   SpO2 97%   BMI 21.46 kg/m   Physical Exam Awake and alert, no acute distress Tachycardic Procedures  Procedures  ED Course / MDM   Clinical Course as of 05/06/24 0434  Fri May 05, 2024  2327 Pregnancy negative.  Hyperglycemia.  Leukocytosis of 15.5 may be reactive in setting of repeated episodes of vomiting.  No significant electrolyte imbalance.  EKG without evidence of dysrhythmia.  Reevaluated patient.  She is feeling better overall but remains tachycardic in the 110s 120s on monitor.  Still feeling some nausea and lightheadedness when standing.  Will provide third liter.  Blood glucose when she checks on Dexcom now below 200s.  No need for insulin  therapy at this time.  LILLETTE Ozell Marine DO, am transitioning care of this patient to the oncoming provider pending reevaluation after third liter of IV fluids and disposition [MP]  Sat May 06, 2024  0236 Patient remains persistently tachycardic.  Prior to going into the room heart rates 115-120.  Upon my arrival to the room, heart rate increases to 120s to 130s.  Blood pressures are soft but stable.  She states that she feels better.  TSH is normal.  She has received over 3 L of fluid at this time as well as Tylenol .  States that she feels sore in her legs but does has not had any leg  swelling or history of blood clots.  Will add a D-dimer, CK, viral testing.  She will need admission to the hospital given persistent tachycardia. [CH]    Clinical Course User Index [CH] Krisalyn Yankowski, Charmaine FALCON, MD [MP] Marine Ozell LABOR, DO   Medical Decision Making Amount and/or Complexity of Data Reviewed Labs: ordered.  Risk OTC drugs. Prescription drug management. Decision regarding hospitalization.          Bari Charmaine FALCON, MD 05/06/24 [phone]

## 2024-05-06 NOTE — Plan of Care (Signed)
  Problem: Education: Goal: Ability to describe self-care measures that may prevent or decrease complications (Diabetes Survival Skills Education) will improve Outcome: Progressing    Problem: Coping: Goal: Ability to adjust to condition or change in health will improve Outcome: Progressing   Problem: Fluid Volume: Goal: Ability to maintain a balanced intake and output will improve Outcome: Progressing   Problem: Metabolic: Goal: Ability to maintain appropriate glucose levels will improve Outcome: Progressing   Problem: Skin Integrity: Goal: Risk for impaired skin integrity will decrease Outcome: Progressing   Problem: Health Behavior/Discharge Planning: Goal: Ability to manage health-related needs will improve Outcome: Progressing   Problem: Clinical Measurements: Goal: Diagnostic test results will improve Outcome: Progressing   Problem: Clinical Measurements: Goal: Respiratory complications will improve Outcome: Progressing   Problem: Clinical Measurements: Goal: Cardiovascular complication will be avoided Outcome: Progressing   Problem: Skin Integrity: Goal: Risk for impaired skin integrity will decrease Outcome: Progressing

## 2024-05-06 NOTE — Plan of Care (Addendum)
 Transferred from [facility] emergency department to [facility] telemetry bed observation status:  33 year old female DM type I presented to emergency department with complaining of nausea, vomiting and diarrhea.  At presentation to ED patient found tachycardic borderline hypotensive and tachypneic. EKG showing sinus tachycardia heart rate 125. Lab, BMP showing low sodium 134, blood glucose 185, elevated anion gap 16 and low bicarb 18.  POC blood glucose 184.  Pending respiratory panel.  Normal D-dimer.  Normal TSH.  CBC showing leukocytosis otherwise unremarkable.  UA unremarkable.  Pregnancy test negative.  CMP slight elevated bilirubin 1.5 otherwise unremarkable.  Normal lipase level.  The ED patient currently on NS fluid.  Also received Zofran  and Ativan .  In the ED patient remained persistently tachycardic even though resuscitated with total 3 L of LR bolus.  Heart rate has been improved 147 to upper 110 range.  Hospitalist consulted for further evaluation management of persistent tachycardia.    [facility] will assume care on arrival to accepting facility. Until arrival, care as per EDP. However, [facility] available 24/7 for questions and assistance. Check [email or web address] for on-call coverage. Nursing staff, please call [facility] Admits & Consults System-Wide number under Amion on patient's arrival so appropriate admitting provider can evaluate the pt.   Author: Antwon Rochin, MD  [facility]

## 2024-05-06 NOTE — ED Notes (Signed)
 I have just given report to Lear, CHARITY FUNDRAISER at [facility] 6th floor.

## 2024-05-07 ENCOUNTER — Observation Stay (HOSPITAL_COMMUNITY)

## 2024-05-07 DIAGNOSIS — I361 Nonrheumatic tricuspid (valve) insufficiency: Secondary | ICD-10-CM | POA: Diagnosis not present

## 2024-05-07 DIAGNOSIS — R000 Tachycardia, unspecified: Secondary | ICD-10-CM | POA: Diagnosis not present

## 2024-05-07 LAB — CBC
HCT: 32.9 % — ABNORMAL LOW (ref 36.0–46.0)
Hemoglobin: 11.4 g/dL — ABNORMAL LOW (ref 12.0–15.0)
MCH: 30.1 pg (ref 26.0–34.0)
MCHC: 34.7 g/dL (ref 30.0–36.0)
MCV: 86.8 fL (ref 80.0–100.0)
Platelets: 219 K/uL (ref 150–400)
RBC: 3.79 MIL/uL — ABNORMAL LOW (ref 3.87–5.11)
RDW: 11.8 % (ref 11.5–15.5)
WBC: 6.6 K/uL (ref 4.0–10.5)
nRBC: 0 % (ref 0.0–0.2)

## 2024-05-07 LAB — ECHOCARDIOGRAM COMPLETE
Area-P 1/2: 3.37 cm2
Height: 64 in
S' Lateral: 2.7 cm
Weight: 2174.4 [oz_av]

## 2024-05-07 LAB — GLUCOSE, CAPILLARY
Glucose-Capillary: 120 mg/dL — ABNORMAL HIGH (ref 70–99)
Glucose-Capillary: 195 mg/dL — ABNORMAL HIGH (ref 70–99)

## 2024-05-07 LAB — COMPREHENSIVE METABOLIC PANEL WITH GFR
ALT: 13 U/L (ref 0–44)
AST: 13 U/L — ABNORMAL LOW (ref 15–41)
Albumin: 2.9 g/dL — ABNORMAL LOW (ref 3.5–5.0)
Alkaline Phosphatase: 46 U/L (ref 38–126)
Anion gap: 11 (ref 5–15)
BUN: 9 mg/dL (ref 6–20)
CO2: 23 mmol/L (ref 22–32)
Calcium: 8.2 mg/dL — ABNORMAL LOW (ref 8.9–10.3)
Chloride: 105 mmol/L (ref 98–111)
Creatinine, Ser: 1.01 mg/dL — ABNORMAL HIGH (ref 0.44–1.00)
GFR, Estimated: >60 mL/min (ref >60)
Glucose, Bld: 127 mg/dL — ABNORMAL HIGH (ref 70–99)
Potassium: 3.9 mmol/L (ref 3.5–5.1)
Sodium: 139 mmol/L (ref 135–145)
Total Bilirubin: 1.3 mg/dL — ABNORMAL HIGH (ref 0.0–1.2)
Total Protein: 5.7 g/dL — ABNORMAL LOW (ref 6.5–8.1)

## 2024-05-07 NOTE — Discharge Summary (Signed)
 Physician Discharge Summary  Ashley Hester FMW:[id number] DOB: [birth year] DOA: 05/05/2024  PCP: [facility]  Admit date: 05/05/2024 Discharge date: 05/07/2024 Recommendations for Outpatient Follow-up:  Follow up with PCP in 1 weeks-call for appointment Please obtain BMP/CBC in one week  Discharge Dispo: Home Discharge Condition: Stable Code Status:   Code Status: Full Code Diet recommendation:  Diet Order             Diet Carb Modified           Diet Carb Modified Fluid consistency: Thin; Room service appropriate? Yes  Diet effective now                    Brief/Interim Summary: Ashley Hester is a 33 y.o. female with PMH of f type 1 diabetes, asthma, ADHD, preeclampsia presenting with nausea vomiting and diarrhea X 1 Day after recently treated for URI, she is unable to tolerate p.o. and cbg reading in the 200s. She also reports chest heaviness but no fever or chills and has not had Adderall in several weeks. In ED: Vital signs blood pressure in the 80s-100s systolic, baseline appears to be in the 90s-110 systolic.  Heart rate in the 90s-140s.  Respiratory rate in the teens-20s. Labs-bicarb 22, gap 16, glucose 208. epeat BMP early this morning showed bicarb worsened to 18 ith gap of 16.  Wbc 15.5, hemoglobin 15.4 which is above previous baseline of 10.  Lipase normal.  Respiratory panel for flu COVID and RSV negative.  Urinalysis with glucose, ketones, protein. UDS with amphetamines consistent with Adderall prescription.Ethanol level negative.  TSH normal.  CK normal.  D-dimer normal. Patient given 3 L of fluids and admitted for persistent tachycardia and lightheadedness on standing and for further workup Patient echocardiogram was unremarkable orthostatic vitals negative She did well with ambulation and eager to go home today tachycardia has resolved-Ackley volume depletion from viral gastroenteritis  Subjective: Seen and examined today Feels much improved.   No chest pain Overnight heart rate improved to 80s-99, BP 99-1 06, on room air, afebrile Labs reviewed leukocytosis improved BMP LFTs stable  Discharge Diagnoses:   Sinus tachycardia Volume depletion Nausea vomiting diarrhea x 1 day-likely viral gastroenteritis, resolved Patient presented with GI symptoms recent URI treatment also hyperglycemia at home unable to tolerate p.o.Underwent extensive workup in the ED and IV fluid resuscitation but still having elevated heart rate so admitted.  Leukocytosis resolved no fever no evidence of infection heart rate now has improved.Follow-up echocardiogram-shows EF 60 to 65% no RWMA RV SF normal mitral valve normal aortic valve grossly normal  orthostatic vitals are negative.  At this time patient feels stable improved and will be discharged  Suspect from volume depletion  Orthostatic VS for the past 24 hrs:  BP- Lying Pulse- Lying BP- Sitting Pulse- Sitting BP- Standing at 0 minutes Pulse- Standing at 0 minutes  05/07/24 0854 110/69 94 106/75 87 110/67 95   Type 1 diabetes w/ hyperglycemia: Sugar is stable and sliding scale  ADHD: Continue Adderall  DVT prophylaxis: enoxaparin  (LOVENOX ) injection 40 mg Start: 05/06/24 1800 Code Status:   Code Status: Full Code Family Communication: plan of care discussed with patient at bedside. Patient status is: Remains hospitalized because of severity of illness Level of care: Telemetry   Dispo: The patient is from: home            Anticipated disposition: home Objective: Vitals last 24 hrs: Vitals:   05/06/24 2338 05/07/24 0318 05/07/24 9256  05/07/24 1159  BP: 110/66 93/66 106/72 111/76  Pulse: 81 84 99 81  Resp: 16 15 16 18   Temp:  97.9 F (36.6 C) 98.7 F (37.1 C) 98.4 F (36.9 C)  TempSrc:  Oral Oral Oral  SpO2: 96% 95% 98%   Weight:      Height:        Physical Examination: General exam: alert awake, oriented, older than stated age HEENT:Oral mucosa moist, Ear/Nose WNL  grossly Respiratory system: Bilaterally clear BS,no use of accessory muscle Cardiovascular system: S1 & S2 +, No JVD. Gastrointestinal system: Abdomen soft,NT,ND, BS+ Nervous System: Alert, awake, moving all extremities,and following commands. Extremities: extremities warm, leg edema Skin: Warm, no rashes MSK: Normal muscle bulk,tone, power    Consultation: See note.  Discharge Instructions  Discharge Instructions     Diet Carb Modified   Complete by: As directed    Discharge instructions   Complete by: As directed    Please call call MD or return to ER for similar or worsening recurring problem that brought you to hospital or if any fever,nausea/vomiting,abdominal pain, uncontrolled pain, chest pain,  shortness of breath or any other alarming symptoms.  Please follow-up your doctor as instructed in a week time and call the office for appointment.  Please avoid alcohol, smoking, or any other illicit substance and maintain healthy habits including taking your regular medications as prescribed.  You were cared for by a hospitalist during your hospital stay. If you have any questions about your discharge medications or the care you received while you were in the hospital after you are discharged, you can call the unit and ask to speak with the hospitalist on call if the hospitalist that took care of you is not available.  Once you are discharged, your primary care physician will handle any further medical issues. Please note that NO REFILLS for any discharge medications will be authorized once you are discharged, as it is imperative that you return to your primary care physician (or establish a relationship with a primary care physician if you do not have one) for your aftercare needs so that they can reassess your need for medications and monitor your lab values   Increase activity slowly   Complete by: As directed       Allergies as of 05/07/2024   No Known Allergies       Medication List     STOP taking these medications    azithromycin 250 MG tablet Commonly known as: ZITHROMAX   fluconazole  150 MG tablet Commonly known as: DIFLUCAN        TAKE these medications    acetaminophen  500 MG tablet Commonly known as: TYLENOL  Take 2 tablets (1,000 mg total) by mouth every 6 (six) hours. What changed:  when to take this reasons to take this   amphetamine-dextroamphetamine 20 MG 24 hr capsule Commonly known as: ADDERALL XR Take 20 mg by mouth every morning.   amphetamine-dextroamphetamine 5 MG tablet Commonly known as: ADDERALL Take 1 tablet by mouth 2 (two) times daily.   HumaLOG 100 UNIT/ML injection Generic drug: insulin  lispro Inject 2-10 Units into the skin See admin instructions. Insulin  places in pump   insulin  pump Soln Inject into the skin 3 times daily with meals, bedtime and 2 AM.   ondansetron  4 MG tablet Commonly known as: ZOFRAN  Take 1 tablet (4 mg total) by mouth every 8 (eight) hours as needed for up to 4 days for nausea or vomiting.  Follow-up Information     [facility], [facility]. Schedule an appointment as soon as possible for a visit in 1 week.   Contact information: [address] [place] [address] [phone]                No Known Allergies  The results of significant diagnostics from this hospitalization (including imaging, microbiology, ancillary and laboratory) are listed below for reference.    Microbiology: Recent Results (from the past 240 hours)  Resp panel by RT-PCR (RSV, Flu A&B, Covid) Anterior Nasal Swab     Status: None   Collection Time: 05/06/24  3:44 AM   Specimen: Anterior Nasal Swab  Result Value Ref Range Status   SARS Coronavirus 2 by RT PCR NEGATIVE NEGATIVE Final    Comment: (NOTE) SARS-CoV-2 target nucleic acids are NOT DETECTED.  The SARS-CoV-2 RNA is generally detectable in upper respiratory specimens during the acute phase of infection. The  lowest concentration of SARS-CoV-2 viral copies this assay can detect is 138 copies/mL. A negative result does not preclude SARS-Cov-2 infection and should not be used as the sole basis for treatment or other patient management decisions. A negative result may occur with  improper specimen collection/handling, submission of specimen other than nasopharyngeal swab, presence of viral mutation(s) within the areas targeted by this assay, and inadequate number of viral copies(<138 copies/mL). A negative result must be combined with clinical observations, patient history, and epidemiological information. The expected result is Negative.  Fact Sheet for Patients:  bloggercourse.com  Fact Sheet for Healthcare Providers:  seriousbroker.it  This test is no t yet approved or cleared by the [place]  FDA and  has been authorized for detection and/or diagnosis of SARS-CoV-2 by FDA under an Emergency Use Authorization (EUA). This EUA will remain  in effect (meaning this test can be used) for the duration of the COVID-19 declaration under Section 564(b)(1) of the Act, 21 U.S.C.section 360bbb-3(b)(1), unless the authorization is terminated  or revoked sooner.       Influenza A by PCR NEGATIVE NEGATIVE Final   Influenza B by PCR NEGATIVE NEGATIVE Final    Comment: (NOTE) The Xpert Xpress SARS-CoV-2/FLU/RSV plus assay is intended as an aid in the diagnosis of influenza from Nasopharyngeal swab specimens and should not be used as a sole basis for treatment. Nasal washings and aspirates are unacceptable for Xpert Xpress SARS-CoV-2/FLU/RSV testing.  Fact Sheet for Patients: bloggercourse.com  Fact Sheet for Healthcare Providers: seriousbroker.it  This test is not yet approved or cleared by the [place]  FDA and has been authorized for detection and/or diagnosis of SARS-CoV-2 by FDA under  an Emergency Use Authorization (EUA). This EUA will remain in effect (meaning this test can be used) for the duration of the COVID-19 declaration under Section 564(b)(1) of the Act, 21 U.S.C. section 360bbb-3(b)(1), unless the authorization is terminated or revoked.     Resp Syncytial Virus by PCR NEGATIVE NEGATIVE Final    Comment: (NOTE) Fact Sheet for Patients: bloggercourse.com  Fact Sheet for Healthcare Providers: seriousbroker.it  This test is not yet approved or cleared by the [place]  FDA and has been authorized for detection and/or diagnosis of SARS-CoV-2 by FDA under an Emergency Use Authorization (EUA). This EUA will remain in effect (meaning this test can be used) for the duration of the COVID-19 declaration under Section 564(b)(1) of the Act, 21 U.S.C. section 360bbb-3(b)(1), unless the authorization is terminated or revoked.  Performed at [facility], [address], [place],  [place] [address]     Procedures/Studies: ECHOCARDIOGRAM COMPLETE Result Date: 05/07/2024    ECHOCARDIOGRAM REPORT   Patient Name:   EMBER HENRIKSON Date of Exam: 05/07/2024 Medical Rec #:  [id number]        Height:       64.0 in Accession #:    [id number]       Weight:       135.9 lb Date of Birth:  29-Oct-1990        BSA:          1.660 m Patient Age:    33 years         BP:           93/66 mmHg Patient Gender: F                HR:           77 bpm. Exam Location:  Inpatient Procedure: 2D Echo, Cardiac Doppler and Color Doppler (Both Spectral and Color            Flow Doppler were utilized during procedure). Indications:    Dyspnea; tachycardia  History:        Patient has no prior history of Echocardiogram examinations.                 Arrythmias:Tachycardia, Signs/Symptoms:Dyspnea; Risk                 Factors:Diabetes.  Sonographer:    Merlynn Argyle Referring Phys: [id number] ALEXANDER B MELVIN IMPRESSIONS  1. Left ventricular  ejection fraction, by estimation, is 60 to 65%. The left ventricle has normal function. The left ventricle has no regional wall motion abnormalities. Left ventricular diastolic parameters were normal.  2. Right ventricular systolic function is normal. The right ventricular size is normal. There is normal pulmonary artery systolic pressure. The estimated right ventricular systolic pressure is 19.3 mmHg.  3. The mitral valve is normal in structure. Trivial mitral valve regurgitation. No evidence of mitral stenosis.  4. The aortic valve is grossly normal. Aortic valve regurgitation is not visualized. No aortic stenosis is present.  5. The inferior vena cava is normal in size with greater than 50% respiratory variability, suggesting right atrial pressure of 3 mmHg. FINDINGS  Left Ventricle: Left ventricular ejection fraction, by estimation, is 60 to 65%. The left ventricle has normal function. The left ventricle has no regional wall motion abnormalities. The left ventricular internal cavity size was normal in size. There is  no left ventricular hypertrophy. Left ventricular diastolic parameters were normal. Right Ventricle: The right ventricular size is normal. No increase in right ventricular wall thickness. Right ventricular systolic function is normal. There is normal pulmonary artery systolic pressure. The tricuspid regurgitant velocity is 2.02 m/s, and  with an assumed right atrial pressure of 3 mmHg, the estimated right ventricular systolic pressure is 19.3 mmHg. Left Atrium: Left atrial size was normal in size. Right Atrium: Right atrial size was normal in size. Pericardium: Trivial pericardial effusion is present. Mitral Valve: The mitral valve is normal in structure. Trivial mitral valve regurgitation. No evidence of mitral valve stenosis. Tricuspid Valve: The tricuspid valve is normal in structure. Tricuspid valve regurgitation is mild . No evidence of tricuspid stenosis. Aortic Valve: The aortic valve is  grossly normal. Aortic valve regurgitation is not visualized. No aortic stenosis is present. Pulmonic Valve: The pulmonic valve was grossly normal. Pulmonic valve regurgitation is trivial. No evidence of pulmonic stenosis. Aorta: The aortic root is normal in  size and structure. Venous: The inferior vena cava is normal in size with greater than 50% respiratory variability, suggesting right atrial pressure of 3 mmHg. IAS/Shunts: The atrial septum is grossly normal.  LEFT VENTRICLE PLAX 2D LVIDd:         3.90 cm   Diastology LVIDs:         2.70 cm   LV e' medial:    12.80 cm/s LV PW:         0.80 cm   LV E/e' medial:  7.2 LV IVS:        0.70 cm   LV e' lateral:   14.00 cm/s LVOT diam:     2.00 cm   LV E/e' lateral: 6.6 LV SV:         70 LV SV Index:   42 LVOT Area:     3.14 cm LV IVRT:       123 msec  RIGHT VENTRICLE             IVC RV Basal diam:  3.00 cm     IVC diam: 1.80 cm RV S prime:     14.50 cm/s TAPSE (M-mode): 2.5 cm LEFT ATRIUM             Index        RIGHT ATRIUM          Index LA diam:        2.10 cm 1.26 cm/m   RA Area:     9.25 cm LA Vol (A2C):   24.7 ml 14.88 ml/m  RA Volume:   17.80 ml 10.72 ml/m LA Vol (A4C):   11.5 ml 6.93 ml/m LA Biplane Vol: 17.7 ml 10.66 ml/m  AORTIC VALVE LVOT Vmax:   113.00 cm/s LVOT Vmean:  75.500 cm/s LVOT VTI:    0.223 m  AORTA Ao Root diam: 3.00 cm Ao Asc diam:  3.00 cm MITRAL VALVE               TRICUSPID VALVE MV Area (PHT): 3.37 cm    TR Peak grad:   16.3 mmHg MV Decel Time: 225 msec    TR Vmax:        202.00 cm/s MV E velocity: 92.80 cm/s MV A velocity: 45.60 cm/s  SHUNTS MV E/A ratio:  2.04        Systemic VTI:  0.22 m                            Systemic Diam: 2.00 cm Soyla Merck MD Electronically signed by Soyla Merck MD Signature Date/Time: 05/07/2024/11:58:44 AM    Final    DG CHEST PORT 1 VIEW Result Date: 05/06/2024 EXAM: 1 VIEW(S) XRAY OF THE CHEST 05/06/2024 03:24:00 PM COMPARISON: Comparison with 12/08/2016. CLINICAL HISTORY: Leukocytosis;  Tachycardia. FINDINGS: LUNGS AND PLEURA: No focal pulmonary opacity. No pleural effusion. No pneumothorax. HEART AND MEDIASTINUM: No acute abnormality of the cardiac and mediastinal silhouettes. BONES AND SOFT TISSUES: No acute osseous abnormality. IMPRESSION: 1. No acute cardiopulmonary process. Electronically signed by: Elsie Gravely MD 05/06/2024 03:35 PM EST RP Workstation: [id number]    Labs: BNP (last 3 results) No results for input(s): BNP in the last 8760 hours. Basic Metabolic Panel: Recent Labs  Lab 05/05/24 1938 05/06/24 0344 05/06/24 1457 05/07/24 0317  NA 138 134* 134* 139  K 4.5 3.9 3.7 3.9  CL 100 100 103 105  CO2 22 18* 23 23  GLUCOSE 208* 185*  122* 127*  BUN 18 14 8 9   CREATININE 0.99 0.78 0.98 1.01*  CALCIUM  9.8 8.9 8.2* 8.2*  MG  --   --  1.6*  --    Liver Function Tests: Recent Labs  Lab 05/05/24 1938 05/06/24 1457 05/07/24 0317  AST 17 14* 13*  ALT 15 14 13   ALKPHOS 75 48 46  BILITOT 1.5* 1.9* 1.3*  PROT 7.8 5.5* 5.7*  ALBUMIN  4.7 2.9* 2.9*   Recent Labs  Lab 05/05/24 1938  LIPASE 16   No results for input(s): AMMONIA in the last 168 hours. CBC: Recent Labs  Lab 05/05/24 1938 05/06/24 1457 05/07/24 0317  WBC 15.5* 6.6 6.6  HGB 15.4* 11.6* 11.4*  HCT 44.4 33.5* 32.9*  MCV 86.5 87.2 86.8  PLT 286 212 219   CBG: Recent Labs  Lab 05/06/24 0808 05/06/24 1219 05/06/24 2118 05/07/24 0745 05/07/24 1200  GLUCAP 163* 134* 129* 120* 195*  Cardiac Enzymes: Recent Labs  Lab 05/06/24 0344  CKTOTAL 37*   BNP: Invalid input(s): POCBNP D-Dimer Recent Labs    05/06/24 0344  DDIMER <0.27   Lipid Profile No results for input(s): CHOL, HDL, LDLCALC, TRIG, CHOLHDL, LDLDIRECT in the last 72 hours. Thyroid  function studies Recent Labs    05/06/24 0119  TSH 0.580   Urinalysis    Component Value Date/Time   COLORURINE YELLOW 05/05/2024 1938   APPEARANCEUR CLEAR 05/05/2024 1938   LABSPEC 1.027 05/05/2024 1938    PHURINE 5.5 05/05/2024 1938   GLUCOSEU 500 (A) 05/05/2024 1938   HGBUR NEGATIVE 05/05/2024 1938   BILIRUBINUR NEGATIVE 05/05/2024 1938   KETONESUR >80 (A) 05/05/2024 1938   PROTEINUR TRACE (A) 05/05/2024 1938   UROBILINOGEN 0.2 07/14/2014 0050   NITRITE NEGATIVE 05/05/2024 1938   LEUKOCYTESUR NEGATIVE 05/05/2024 1938   Sepsis Labs Recent Labs  Lab 05/05/24 1938 05/06/24 1457 05/07/24 0317  WBC 15.5* 6.6 6.6   Microbiology Recent Results (from the past 240 hours)  Resp panel by RT-PCR (RSV, Flu A&B, Covid) Anterior Nasal Swab     Status: None   Collection Time: 05/06/24  3:44 AM   Specimen: Anterior Nasal Swab  Result Value Ref Range Status   SARS Coronavirus 2 by RT PCR NEGATIVE NEGATIVE Final    Comment: (NOTE) SARS-CoV-2 target nucleic acids are NOT DETECTED.  The SARS-CoV-2 RNA is generally detectable in upper respiratory specimens during the acute phase of infection. The lowest concentration of SARS-CoV-2 viral copies this assay can detect is 138 copies/mL. A negative result does not preclude SARS-Cov-2 infection and should not be used as the sole basis for treatment or other patient management decisions. A negative result may occur with  improper specimen collection/handling, submission of specimen other than nasopharyngeal swab, presence of viral mutation(s) within the areas targeted by this assay, and inadequate number of viral copies(<138 copies/mL). A negative result must be combined with clinical observations, patient history, and epidemiological information. The expected result is Negative.  Fact Sheet for Patients:  bloggercourse.com  Fact Sheet for Healthcare Providers:  seriousbroker.it  This test is no t yet approved or cleared by the [place]  FDA and  has been authorized for detection and/or diagnosis of SARS-CoV-2 by FDA under an Emergency Use Authorization (EUA). This EUA will remain  in effect  (meaning this test can be used) for the duration of the COVID-19 declaration under Section 564(b)(1) of the Act, 21 U.S.C.section 360bbb-3(b)(1), unless the authorization is terminated  or revoked sooner.       Influenza  A by PCR NEGATIVE NEGATIVE Final   Influenza B by PCR NEGATIVE NEGATIVE Final    Comment: (NOTE) The Xpert Xpress SARS-CoV-2/FLU/RSV plus assay is intended as an aid in the diagnosis of influenza from Nasopharyngeal swab specimens and should not be used as a sole basis for treatment. Nasal washings and aspirates are unacceptable for Xpert Xpress SARS-CoV-2/FLU/RSV testing.  Fact Sheet for Patients: bloggercourse.com  Fact Sheet for Healthcare Providers: seriousbroker.it  This test is not yet approved or cleared by the [place]  FDA and has been authorized for detection and/or diagnosis of SARS-CoV-2 by FDA under an Emergency Use Authorization (EUA). This EUA will remain in effect (meaning this test can be used) for the duration of the COVID-19 declaration under Section 564(b)(1) of the Act, 21 U.S.C. section 360bbb-3(b)(1), unless the authorization is terminated or revoked.     Resp Syncytial Virus by PCR NEGATIVE NEGATIVE Final    Comment: (NOTE) Fact Sheet for Patients: bloggercourse.com  Fact Sheet for Healthcare Providers: seriousbroker.it  This test is not yet approved or cleared by the [place]  FDA and has been authorized for detection and/or diagnosis of SARS-CoV-2 by FDA under an Emergency Use Authorization (EUA). This EUA will remain in effect (meaning this test can be used) for the duration of the COVID-19 declaration under Section 564(b)(1) of the Act, 21 U.S.C. section 360bbb-3(b)(1), unless the authorization is terminated or revoked.  Performed at [facility], [address] [place], [place] [address]    Time  coordinating discharge: 25 minutes  SIGNED: Mennie LAMY, MD  [facility] 05/07/2024, 1:46 PM  If 7PM-7AM, please contact night-coverage [email or web address]

## 2024-05-07 NOTE — Hospital Course (Addendum)
 Ashley Hester is a 33 y.o. female with PMH of f type 1 diabetes, asthma, ADHD, preeclampsia presenting with nausea vomiting and diarrhea X 1 Day after recently treated for URI, she is unable to tolerate p.o. and cbg reading in the 200s. She also reports chest heaviness but no fever or chills and has not had Adderall in several weeks. In ED: Vital signs blood pressure in the 80s-100s systolic, baseline appears to be in the 90s-110 systolic.  Heart rate in the 90s-140s.  Respiratory rate in the teens-20s. Labs-bicarb 22, gap 16, glucose 208. epeat BMP early this morning showed bicarb worsened to 18 ith gap of 16.  Wbc 15.5, hemoglobin 15.4 which is above previous baseline of 10.  Lipase normal.  Respiratory panel for flu COVID and RSV negative.  Urinalysis with glucose, ketones, protein. UDS with amphetamines consistent with Adderall prescription.Ethanol level negative.  TSH normal.  CK normal.  D-dimer normal. Patient given 3 L of fluids and admitted for persistent tachycardia and lightheadedness on standing and for further workup Patient echocardiogram was unremarkable orthostatic vitals negative She did well with ambulation and eager to go home today tachycardia has resolved-[place] volume depletion from viral gastroenteritis  Subjective: Seen and examined today Feels much improved.  No chest pain Overnight heart rate improved to 80s-99, BP 99-1 06, on room air, afebrile Labs reviewed leukocytosis improved BMP LFTs stable  Discharge Diagnoses:   Sinus tachycardia Volume depletion Nausea vomiting diarrhea x 1 day-likely viral gastroenteritis, resolved Patient presented with GI symptoms recent URI treatment also hyperglycemia at home unable to tolerate p.o.Underwent extensive workup in the ED and IV fluid resuscitation but still having elevated heart rate so admitted.  Leukocytosis resolved no fever no evidence of infection heart rate now has improved.Follow-up echocardiogram-shows EF 60 to 65%  no RWMA RV SF normal mitral valve normal aortic valve grossly normal  orthostatic vitals are negative.  At this time patient feels stable improved and will be discharged  Suspect from volume depletion  Orthostatic VS for the past 24 hrs:  BP- Lying Pulse- Lying BP- Sitting Pulse- Sitting BP- Standing at 0 minutes Pulse- Standing at 0 minutes  05/07/24 0854 110/69 94 106/75 87 110/67 95   Type 1 diabetes w/ hyperglycemia: Sugar is stable and sliding scale  ADHD: Continue Adderall  DVT prophylaxis: enoxaparin  (LOVENOX ) injection 40 mg Start: 05/06/24 1800 Code Status:   Code Status: Full Code Family Communication: plan of care discussed with patient at bedside. Patient status is: Remains hospitalized because of severity of illness Level of care: Telemetry   Dispo: The patient is from: home            Anticipated disposition: home Objective: Vitals last 24 hrs: Vitals:   05/06/24 2338 05/07/24 0318 05/07/24 0743 05/07/24 1159  BP: 110/66 93/66 106/72 111/76  Pulse: 81 84 99 81  Resp: 16 15 16 18   Temp:  97.9 F (36.6 C) 98.7 F (37.1 C) 98.4 F (36.9 C)  TempSrc:  Oral Oral Oral  SpO2: 96% 95% 98%   Weight:      Height:        Physical Examination: General exam: alert awake, oriented, older than stated age HEENT:Oral mucosa moist, Ear/Nose WNL grossly Respiratory system: Bilaterally clear BS,no use of accessory muscle Cardiovascular system: S1 & S2 +, No JVD. Gastrointestinal system: Abdomen soft,NT,ND, BS+ Nervous System: Alert, awake, moving all extremities,and following commands. Extremities: extremities warm, leg edema Skin: Warm, no rashes MSK: Normal muscle bulk,tone, power

## 2024-05-07 NOTE — Progress Notes (Signed)
 Echocardiogram 2D Echocardiogram has been performed.  Ashley Hester 05/07/2024, 10:55 AM

## 2024-05-09 ENCOUNTER — Encounter: Payer: Self-pay | Admitting: Internal Medicine
